# Patient Record
Sex: Female | Born: 1970 | Race: White | Hispanic: No | Marital: Married | Smoking: Current every day smoker
Health system: Southern US, Community
[De-identification: ages and names within clinical notes are randomized; demographics above are authoritative.]

## PROBLEM LIST (undated history)

## (undated) DIAGNOSIS — G8929 Other chronic pain: Secondary | ICD-10-CM

## (undated) DIAGNOSIS — A599 Trichomoniasis, unspecified: Secondary | ICD-10-CM

## (undated) DIAGNOSIS — R1013 Epigastric pain: Secondary | ICD-10-CM

## (undated) DIAGNOSIS — A549 Gonococcal infection, unspecified: Secondary | ICD-10-CM

## (undated) DIAGNOSIS — N7093 Salpingitis and oophoritis, unspecified: Secondary | ICD-10-CM

## (undated) DIAGNOSIS — W3400XA Accidental discharge from unspecified firearms or gun, initial encounter: Secondary | ICD-10-CM

## (undated) DIAGNOSIS — A5611 Chlamydial female pelvic inflammatory disease: Secondary | ICD-10-CM

## (undated) DIAGNOSIS — M549 Dorsalgia, unspecified: Secondary | ICD-10-CM

## (undated) HISTORY — PX: OOPHORECTOMY: SHX86

## (undated) HISTORY — PX: ABDOMINAL SURGERY: SHX537

---

## 2006-11-16 ENCOUNTER — Emergency Department (HOSPITAL_COMMUNITY): Admission: EM | Admit: 2006-11-16 | Discharge: 2006-11-16 | Payer: Self-pay | Admitting: Emergency Medicine

## 2007-02-24 ENCOUNTER — Emergency Department (HOSPITAL_COMMUNITY): Admission: EM | Admit: 2007-02-24 | Discharge: 2007-02-25 | Payer: Self-pay | Admitting: Emergency Medicine

## 2007-02-27 ENCOUNTER — Emergency Department (HOSPITAL_COMMUNITY): Admission: EM | Admit: 2007-02-27 | Discharge: 2007-02-28 | Payer: Self-pay | Admitting: Emergency Medicine

## 2007-02-28 ENCOUNTER — Emergency Department (HOSPITAL_COMMUNITY): Admission: EM | Admit: 2007-02-28 | Discharge: 2007-02-28 | Payer: Self-pay | Admitting: Emergency Medicine

## 2007-04-07 ENCOUNTER — Emergency Department (HOSPITAL_COMMUNITY): Admission: EM | Admit: 2007-04-07 | Discharge: 2007-04-07 | Payer: Self-pay | Admitting: Emergency Medicine

## 2007-04-08 ENCOUNTER — Inpatient Hospital Stay (HOSPITAL_COMMUNITY): Admission: AD | Admit: 2007-04-08 | Discharge: 2007-04-11 | Payer: Self-pay | Admitting: Psychiatry

## 2007-04-08 ENCOUNTER — Ambulatory Visit: Payer: Self-pay | Admitting: Psychiatry

## 2008-07-26 ENCOUNTER — Inpatient Hospital Stay: Admission: AD | Admit: 2008-07-26 | Discharge: 2008-07-26 | Payer: Self-pay | Admitting: Obstetrics

## 2008-07-26 ENCOUNTER — Inpatient Hospital Stay (HOSPITAL_COMMUNITY): Admission: AD | Admit: 2008-07-26 | Discharge: 2008-07-26 | Payer: Self-pay | Admitting: Obstetrics

## 2008-07-26 ENCOUNTER — Encounter (INDEPENDENT_AMBULATORY_CARE_PROVIDER_SITE_OTHER): Payer: Self-pay | Admitting: Obstetrics

## 2008-07-26 ENCOUNTER — Other Ambulatory Visit: Payer: Self-pay | Admitting: Obstetrics

## 2011-01-27 ENCOUNTER — Emergency Department (INDEPENDENT_AMBULATORY_CARE_PROVIDER_SITE_OTHER)
Admission: EM | Admit: 2011-01-27 | Discharge: 2011-01-27 | Payer: Self-pay | Source: Home / Self Care | Admitting: Emergency Medicine

## 2011-01-27 DIAGNOSIS — R109 Unspecified abdominal pain: Secondary | ICD-10-CM

## 2011-01-27 LAB — PREGNANCY, URINE: Preg Test, Ur: NEGATIVE

## 2011-01-27 LAB — URINALYSIS, ROUTINE W REFLEX MICROSCOPIC
Hgb urine dipstick: NEGATIVE
Nitrite: NEGATIVE
Urine Glucose, Fasting: NEGATIVE mg/dL
Urobilinogen, UA: 1 mg/dL (ref 0.0–1.0)

## 2011-01-27 LAB — DIFFERENTIAL
Basophils Absolute: 0.1 10*3/uL (ref 0.0–0.1)
Basophils Relative: 1 % (ref 0–1)
Eosinophils Absolute: 0.1 10*3/uL (ref 0.0–0.7)
Eosinophils Relative: 1 % (ref 0–5)
Lymphocytes Relative: 28 % (ref 12–46)
Lymphs Abs: 2.4 10*3/uL (ref 0.7–4.0)
Monocytes Absolute: 0.5 10*3/uL (ref 0.1–1.0)
Monocytes Relative: 6 % (ref 3–12)
Neutro Abs: 5.5 10*3/uL (ref 1.7–7.7)
Neutrophils Relative %: 64 % (ref 43–77)

## 2011-01-27 LAB — CBC
HCT: 40.4 % (ref 36.0–46.0)
Hemoglobin: 14 g/dL (ref 12.0–15.0)
MCH: 32.9 pg (ref 26.0–34.0)
MCHC: 34.7 g/dL (ref 30.0–36.0)
MCV: 95.1 fL (ref 78.0–100.0)
Platelets: 261 10*3/uL (ref 150–400)
RBC: 4.25 MIL/uL (ref 3.87–5.11)
RDW: 12.2 % (ref 11.5–15.5)
WBC: 8.6 10*3/uL (ref 4.0–10.5)

## 2011-01-27 LAB — BASIC METABOLIC PANEL
CO2: 24 mEq/L (ref 19–32)
Chloride: 112 mEq/L (ref 96–112)
GFR calc Af Amer: >60 mL/min (ref >60)
GFR calc non Af Amer: >60 mL/min (ref >60)

## 2011-05-08 ENCOUNTER — Emergency Department (HOSPITAL_COMMUNITY): Payer: Self-pay

## 2011-05-08 ENCOUNTER — Emergency Department (HOSPITAL_COMMUNITY)
Admission: EM | Admit: 2011-05-08 | Discharge: 2011-05-08 | Disposition: A | Discharge status: Home / Self Care | Payer: Self-pay | Attending: Emergency Medicine | Admitting: Emergency Medicine

## 2011-05-08 DIAGNOSIS — W1809XA Striking against other object with subsequent fall, initial encounter: Secondary | ICD-10-CM | POA: Insufficient documentation

## 2011-05-08 DIAGNOSIS — S2239XA Fracture of one rib, unspecified side, initial encounter for closed fracture: Secondary | ICD-10-CM | POA: Insufficient documentation

## 2011-05-08 DIAGNOSIS — F17200 Nicotine dependence, unspecified, uncomplicated: Secondary | ICD-10-CM | POA: Insufficient documentation
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
## 2011-08-14 ENCOUNTER — Emergency Department (HOSPITAL_COMMUNITY)
Admission: EM | Admit: 2011-08-14 | Discharge: 2011-08-14 | Disposition: A | Discharge status: Home / Self Care | Payer: Self-pay | Attending: Emergency Medicine | Admitting: Emergency Medicine

## 2011-08-14 DIAGNOSIS — K089 Disorder of teeth and supporting structures, unspecified: Secondary | ICD-10-CM | POA: Insufficient documentation

## 2011-09-04 ENCOUNTER — Emergency Department (HOSPITAL_COMMUNITY): Payer: No Typology Code available for payment source

## 2011-09-04 ENCOUNTER — Emergency Department (HOSPITAL_COMMUNITY)
Admission: EM | Admit: 2011-09-04 | Discharge: 2011-09-04 | Disposition: A | Discharge status: Home / Self Care | Payer: No Typology Code available for payment source | Attending: Emergency Medicine | Admitting: Emergency Medicine

## 2011-09-04 DIAGNOSIS — S139XXA Sprain of joints and ligaments of unspecified parts of neck, initial encounter: Secondary | ICD-10-CM | POA: Insufficient documentation

## 2011-09-04 DIAGNOSIS — Y92410 Unspecified street and highway as the place of occurrence of the external cause: Secondary | ICD-10-CM | POA: Insufficient documentation

## 2011-09-14 ENCOUNTER — Emergency Department (HOSPITAL_COMMUNITY)
Admission: EM | Admit: 2011-09-14 | Discharge: 2011-09-15 | Discharge status: Against Medical Advice | Payer: Self-pay | Attending: Emergency Medicine | Admitting: Emergency Medicine

## 2011-09-14 DIAGNOSIS — R109 Unspecified abdominal pain: Secondary | ICD-10-CM | POA: Insufficient documentation

## 2011-09-15 ENCOUNTER — Emergency Department (HOSPITAL_COMMUNITY)
Admission: EM | Admit: 2011-09-15 | Discharge: 2011-09-15 | Disposition: A | Discharge status: Home / Self Care | Payer: Self-pay | Attending: Emergency Medicine | Admitting: Emergency Medicine

## 2011-09-15 ENCOUNTER — Emergency Department (HOSPITAL_COMMUNITY): Payer: Self-pay

## 2011-09-15 DIAGNOSIS — F17200 Nicotine dependence, unspecified, uncomplicated: Secondary | ICD-10-CM | POA: Insufficient documentation

## 2011-09-15 DIAGNOSIS — R109 Unspecified abdominal pain: Secondary | ICD-10-CM | POA: Insufficient documentation

## 2011-09-15 DIAGNOSIS — K5900 Constipation, unspecified: Secondary | ICD-10-CM | POA: Insufficient documentation

## 2011-09-28 LAB — DIFFERENTIAL
Eosinophils Relative: 0
Lymphocytes Relative: 20
Lymphs Abs: 2
Neutrophils Relative %: 76

## 2011-09-28 LAB — CBC
HCT: 36.7
Hemoglobin: 12.6
MCHC: 34.8
MCV: 95.5
RBC: 4.42
RBC: 3.78 — ABNORMAL LOW
RDW: 12.9
WBC: 8.8

## 2011-09-28 LAB — URINE MICROSCOPIC-ADD ON

## 2011-09-28 LAB — POCT PREGNANCY, URINE: Operator id: 151321

## 2011-09-28 LAB — URINALYSIS, ROUTINE W REFLEX MICROSCOPIC
Nitrite: NEGATIVE
Specific Gravity, Urine: 1.009

## 2011-09-28 LAB — ABO/RH: ABO/RH(D): AB POS

## 2011-09-28 LAB — WET PREP, GENITAL: Yeast Wet Prep HPF POC: NONE SEEN

## 2011-09-28 LAB — TYPE AND SCREEN

## 2011-11-11 ENCOUNTER — Emergency Department (HOSPITAL_COMMUNITY)
Admission: EM | Admit: 2011-11-11 | Discharge: 2011-11-11 | Disposition: A | Discharge status: Home / Self Care | Payer: Self-pay | Attending: Emergency Medicine | Admitting: Emergency Medicine

## 2011-11-11 DIAGNOSIS — K047 Periapical abscess without sinus: Secondary | ICD-10-CM | POA: Insufficient documentation

## 2011-11-11 DIAGNOSIS — K0889 Other specified disorders of teeth and supporting structures: Secondary | ICD-10-CM

## 2011-11-11 DIAGNOSIS — F17200 Nicotine dependence, unspecified, uncomplicated: Secondary | ICD-10-CM | POA: Insufficient documentation

## 2011-11-11 MED ORDER — IBUPROFEN 600 MG PO TABS: 600.0000 mg | ORAL_TABLET | Freq: Four times a day (QID) | ORAL | Status: AC | PRN

## 2011-11-11 MED ORDER — HYDROCODONE-ACETAMINOPHEN 5-500 MG PO TABS: 1.0000 | ORAL_TABLET | Freq: Four times a day (QID) | ORAL | Status: AC | PRN

## 2011-11-11 MED ORDER — AMOXICILLIN 500 MG PO CAPS: 500.0000 mg | ORAL_CAPSULE | Freq: Three times a day (TID) | ORAL | Status: AC

## 2011-11-11 NOTE — ED Provider Notes (Signed)
History     CSN: [id number] Arrival date & time: 11/11/2011  7:38 AM   First MD Initiated Contact with Patient 11/11/11 0840      Chief Complaint  Patient presents with  . Dental Pain    states broken tooth lower back right states hurting all night no relief with ibuprofen states onset last week no apparent distress states pain 6/10    (Consider location/radiation/quality/duration/timing/severity/associated sxs/prior treatment) Patient is a 40 y.o. female presenting with tooth pain. The history is provided by the patient.  Dental PainPrimary symptoms do not include fever, shortness of breath or sore throat.  pt c/o toothpain in past few days. Hx same. Denies injury. No local dentist. No fever or chills. No facial redness or swelling. No trouble breathing or swallowing. Pain, constant, dull, non radiating, worse w chewing/eating.   History reviewed. No pertinent past medical history.  Past Surgical History  Procedure Date  . Abdominal surgery     No family history on file.  History  Substance Use Topics  . Smoking status: Current Everyday Smoker  . Smokeless tobacco: Not on file  . Alcohol Use: No    OB History    Grav Para Term Preterm Abortions TAB SAB Ect Mult Living                  Review of Systems  Constitutional: Negative for fever.  HENT: Negative for sore throat.   Respiratory: Negative for choking and shortness of breath.   Skin: Negative for rash.    Allergies  Review of patient's allergies indicates no known allergies.  Home Medications  No current outpatient prescriptions on file.  BP 132/69  Pulse 86  Temp(Src) 98.6 F (37 C) (Oral)  Resp 16  SpO2 99%  LMP 10/29/2011  Physical Exam  Nursing note and vitals reviewed. Constitutional: She appears well-developed and well-nourished. No distress.  HENT:       Right posterior molar decayed, broken off, assoc gum swelling and tenderness. No trismus. No pharyngeal or floor of mouth swelling or  tenderness.   Eyes: Conjunctivae are normal. No scleral icterus.  Neck: Neck supple. No tracheal deviation present.  Cardiovascular: Normal rate.   Pulmonary/Chest: Effort normal. No respiratory distress.  Abdominal: Normal appearance. She exhibits no distension.  Musculoskeletal: She exhibits no edema.  Neurological: She is alert.  Skin: Skin is warm and dry. No rash noted.  Psychiatric: She has a normal mood and affect.    ED Course  Procedures (including critical care time)  Labs Reviewed - No data to display No results found.   No diagnosis found.    MDM  Discussed need for close dental follow up. Confirmed nkda. Will give rx.         Suzi Roots, MD 11/11/11 [id number]

## 2012-01-13 ENCOUNTER — Encounter (HOSPITAL_COMMUNITY): Payer: Self-pay | Admitting: *Deleted

## 2012-01-13 ENCOUNTER — Emergency Department (HOSPITAL_COMMUNITY)
Admission: EM | Admit: 2012-01-13 | Discharge: 2012-01-13 | Disposition: A | Discharge status: Home / Self Care | Payer: Self-pay | Attending: Emergency Medicine | Admitting: Emergency Medicine

## 2012-01-13 DIAGNOSIS — F17200 Nicotine dependence, unspecified, uncomplicated: Secondary | ICD-10-CM | POA: Insufficient documentation

## 2012-01-13 DIAGNOSIS — K0889 Other specified disorders of teeth and supporting structures: Secondary | ICD-10-CM

## 2012-01-13 DIAGNOSIS — K029 Dental caries, unspecified: Secondary | ICD-10-CM | POA: Insufficient documentation

## 2012-01-13 DIAGNOSIS — K089 Disorder of teeth and supporting structures, unspecified: Secondary | ICD-10-CM | POA: Insufficient documentation

## 2012-01-13 MED ORDER — PENICILLIN V POTASSIUM 500 MG PO TABS: 500.0000 mg | ORAL_TABLET | Freq: Three times a day (TID) | ORAL | Status: AC

## 2012-01-13 MED ORDER — OXYCODONE-ACETAMINOPHEN 5-325 MG PO TABS: 2.0000 | ORAL_TABLET | ORAL | Status: AC | PRN

## 2012-01-13 NOTE — ED Provider Notes (Signed)
History     CSN: [id number]  Arrival date & time 01/13/12  1117   First MD Initiated Contact with Patient 01/13/12 1146      Chief Complaint  Patient presents with  . Dental Pain    (Consider location/radiation/quality/duration/timing/severity/associated sxs/prior treatment) Patient is a 41 y.o. female presenting with tooth pain. The history is provided by the patient.  Dental PainPrimary symptoms do not include dental injury, oral bleeding, oral lesions, headaches, fever or sore throat. The symptoms are waxing and waning.  Additional symptoms include: gum swelling and gum tenderness. Additional symptoms do not include: purulent gums, trismus, facial swelling and drooling.    History reviewed. No pertinent past medical history.  Past Surgical History  Procedure Date  . Abdominal surgery     History reviewed. No pertinent family history.  History  Substance Use Topics  . Smoking status: Current Everyday Smoker -- 1.0 packs/day    Types: Cigarettes  . Smokeless tobacco: Never Used  . Alcohol Use: No    OB History    Grav Para Term Preterm Abortions TAB SAB Ect Mult Living                  Review of Systems  Constitutional: Negative for fever and chills.  HENT: Negative for sore throat, facial swelling, drooling, neck pain and neck stiffness.   Neurological: Negative for headaches.    Allergies  Review of patient's allergies indicates no known allergies.  Home Medications   Current Outpatient Rx  Name Route Sig Dispense Refill  . IBUPROFEN 200 MG PO TABS Oral Take 600-800 mg by mouth every 6 (six) hours as needed. For pain      BP 141/80  Pulse 77  Temp(Src) 97.9 F (36.6 C) (Oral)  Resp 20  SpO2 99%  LMP 01/13/2012  Physical Exam  Nursing note and vitals reviewed. Constitutional: She is oriented to person, place, and time. She appears well-developed and well-nourished. No distress.  HENT:  Head: Normocephalic and atraumatic. No trismus in the jaw.    Right Ear: Tympanic membrane normal.  Left Ear: Tympanic membrane normal.  Mouth/Throat: Uvula is midline, oropharynx is clear and moist and mucous membranes are normal. No oral lesions. Dental caries present. No dental abscesses or uvula swelling.    Neck: Normal range of motion. Neck supple.  Cardiovascular: Normal rate, regular rhythm and normal heart sounds.   Pulmonary/Chest: Effort normal and breath sounds normal.  Neurological: She is alert and oriented to person, place, and time.  Skin: Skin is warm and dry. She is not diaphoretic.  Psychiatric: She has a normal mood and affect.    ED Course  Procedures (including critical care time)  Labs Reviewed - No data to display No results found.   No diagnosis found.    MDM  Patient with full ROM of neck and no trismus.  Therefore, not concerned for Ludwig's angina.  Feel that pain is secondary to tooth decay.  Patient given a short course of pain medication, penicillin, and instructed to follow up with Dentist tomorrow.  Patient given phone number for Dr. Mayford Knife the dentist on call.        Pascal Lux [facility] 01/13/12 1609

## 2012-01-13 NOTE — ED Notes (Signed)
Pt from home with c/o tooth pain in R bottom jaw "off and on" for 2-3 months.

## 2012-01-16 NOTE — ED Provider Notes (Signed)
Medical screening examination/treatment/procedure(s) were performed by non-physician practitioner and as supervising physician I was immediately available for consultation/collaboration.  Giancarlos Berendt T Ashyr Hedgepath, MD 01/16/12 1659 

## 2012-03-23 ENCOUNTER — Encounter (HOSPITAL_COMMUNITY): Payer: Self-pay | Admitting: *Deleted

## 2012-03-23 ENCOUNTER — Emergency Department (HOSPITAL_COMMUNITY): Payer: Self-pay

## 2012-03-23 ENCOUNTER — Emergency Department (HOSPITAL_COMMUNITY)
Admission: EM | Admit: 2012-03-23 | Discharge: 2012-03-23 | Disposition: A | Discharge status: Home / Self Care | Payer: Self-pay | Attending: Emergency Medicine | Admitting: Emergency Medicine

## 2012-03-23 DIAGNOSIS — R1012 Left upper quadrant pain: Secondary | ICD-10-CM | POA: Insufficient documentation

## 2012-03-23 DIAGNOSIS — R112 Nausea with vomiting, unspecified: Secondary | ICD-10-CM | POA: Insufficient documentation

## 2012-03-23 LAB — URINALYSIS, ROUTINE W REFLEX MICROSCOPIC
Glucose, UA: NEGATIVE mg/dL
Hgb urine dipstick: NEGATIVE
Leukocytes, UA: NEGATIVE
Specific Gravity, Urine: 1.023 (ref 1.005–1.030)
Urobilinogen, UA: 0.2 mg/dL (ref 0.0–1.0)

## 2012-03-23 LAB — COMPREHENSIVE METABOLIC PANEL
ALT: 15 U/L (ref 0–35)
AST: 17 U/L (ref 0–37)
Alkaline Phosphatase: 63 U/L (ref 39–117)
CO2: 30 mEq/L (ref 19–32)
Chloride: 102 mEq/L (ref 96–112)
GFR calc non Af Amer: 77 mL/min — ABNORMAL LOW (ref >90)
Potassium: 3.7 mEq/L (ref 3.5–5.1)
Sodium: 140 mEq/L (ref 135–145)
Total Bilirubin: 0.2 mg/dL — ABNORMAL LOW (ref 0.3–1.2)

## 2012-03-23 LAB — DIFFERENTIAL
Basophils Absolute: 0.1 10*3/uL (ref 0.0–0.1)
Lymphocytes Relative: 40 % (ref 12–46)
Monocytes Absolute: 0.3 10*3/uL (ref 0.1–1.0)
Neutro Abs: 2.7 10*3/uL (ref 1.7–7.7)
Neutrophils Relative %: 48 % (ref 43–77)

## 2012-03-23 LAB — CBC
HCT: 39.9 % (ref 36.0–46.0)
Platelets: 280 10*3/uL (ref 150–400)
RDW: 12.1 % (ref 11.5–15.5)
WBC: 5.5 10*3/uL (ref 4.0–10.5)

## 2012-03-23 LAB — PREGNANCY, URINE: Preg Test, Ur: NEGATIVE

## 2012-03-23 MED ORDER — SODIUM CHLORIDE 0.9 % IV BOLUS (SEPSIS)
1000.0000 mL | Freq: Once | INTRAVENOUS | Status: AC
  Administered 2012-03-23 (×2): 1000 mL via INTRAVENOUS

## 2012-03-23 MED ORDER — MORPHINE SULFATE 4 MG/ML IJ SOLN
4.0000 mg | Freq: Once | INTRAMUSCULAR | Status: AC
  Administered 2012-03-23: 4 mg via INTRAVENOUS
  Filled 2012-03-23: qty 1

## 2012-03-23 MED ORDER — MAGNESIUM CITRATE PO SOLN: 296.0000 mL | Freq: Once | ORAL | Status: AC

## 2012-03-23 MED ORDER — DOCUSATE SODIUM 100 MG PO CAPS: 100.0000 mg | ORAL_CAPSULE | Freq: Two times a day (BID) | ORAL | Status: AC

## 2012-03-23 MED ORDER — IOHEXOL 300 MG/ML  SOLN
100.0000 mL | Freq: Once | INTRAMUSCULAR | Status: AC | PRN
  Administered 2012-03-23: 100 mL via INTRAVENOUS
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
## 2012-03-23 NOTE — ED Notes (Signed)
Pt from home with reports of LUQ pain x 2 days, also endorses nausea.

## 2012-03-23 NOTE — Discharge Instructions (Signed)
Abdominal Pain  Abdominal pain can be caused by many things. Your caregiver decides the seriousness of your pain by an examination and possibly blood tests and X-rays. Many cases can be observed and treated at home. Most abdominal pain is not caused by a disease and will probably improve without treatment. However, in many cases, more time must pass before a clear cause of the pain can be found. Before that point, it may not be known if you need more testing, or if hospitalization or surgery is needed.  HOME CARE INSTRUCTIONS    Do not take laxatives unless directed by your caregiver.   Take pain medicine only as directed by your caregiver.   Only take over-the-counter or prescription medicines for pain, discomfort, or fever as directed by your caregiver.   Try a clear liquid diet (broth, tea, or water) for as long as directed by your caregiver. Slowly move to a bland diet as tolerated.  SEEK IMMEDIATE MEDICAL CARE IF:    The pain does not go away.   You have a fever.   You keep throwing up (vomiting).   The pain is felt only in portions of the abdomen. Pain in the right side could possibly be appendicitis. In an adult, pain in the left lower portion of the abdomen could be colitis or diverticulitis.   You pass bloody or black tarry stools.  MAKE SURE YOU:    Understand these instructions.   Will watch your condition.   Will get help right away if you are not doing well or get worse.  Document Released: 09/26/2005 Document Revised: 12/06/2011 Document Reviewed: 08/04/2008  ExitCare Patient Information 2012 [employer]  Constipation in Adults  Constipation is having fewer than 2 bowel movements per week. Usually, the stools are hard. As we grow older, constipation is more common. If you try to fix constipation with laxatives, the problem may get worse. This is because laxatives taken over a long period of time make the colon muscles weaker. A low-fiber diet, not taking in enough fluids, and taking  some medicines may make these problems worse.  MEDICATIONS THAT MAY CAUSE CONSTIPATION   Water pills (diuretics).   Calcium channel blockers (used to control blood pressure and for the heart).   Certain pain medicines (narcotics).   Anticholinergics.   Anti-inflammatory agents.   Antacids that contain aluminum.  DISEASES THAT CONTRIBUTE TO CONSTIPATION   Diabetes.   Parkinson's disease.   Dementia.   Stroke.   Depression.   Illnesses that cause problems with salt and water metabolism.  HOME CARE INSTRUCTIONS    Constipation is usually best cared for without medicines. Increasing dietary fiber and eating more fruits and vegetables is the best way to manage constipation.   Slowly increase fiber intake to 25 to 38 grams per day. Whole grains, fruits, vegetables, and legumes are good sources of fiber. A dietitian can further help you incorporate high-fiber foods into your diet.   Drink enough water and fluids to keep your urine clear or pale yellow.   A fiber supplement may be added to your diet if you cannot get enough fiber from foods.   Increasing your activities also helps improve regularity.   Suppositories, as suggested by your caregiver, will also help. If you are using antacids, such as aluminum or calcium containing products, it will be helpful to switch to products containing magnesium if your caregiver says it is okay.   If you have been given a liquid injection (enema) today,   this is only a temporary measure. It should not be relied on for treatment of longstanding (chronic) constipation.   Stronger measures, such as magnesium sulfate, should be avoided if possible. This may cause uncontrollable diarrhea. Using magnesium sulfate may not allow you time to make it to the bathroom.  SEEK IMMEDIATE MEDICAL CARE IF:    There is bright red blood in the stool.   The constipation stays for more than 4 days.   There is belly (abdominal) or rectal pain.   You do not seem to be getting  better.   You have any questions or concerns.  MAKE SURE YOU:    Understand these instructions.   Will watch your condition.   Will get help right away if you are not doing well or get worse.  Document Released: 09/14/2004 Document Revised: 12/06/2011 Document Reviewed: 11/20/2011  [employer] Patient Information 2012 [employer].

## 2012-11-19 ENCOUNTER — Emergency Department (HOSPITAL_COMMUNITY)
Admission: EM | Admit: 2012-11-19 | Discharge: 2012-11-19 | Disposition: A | Discharge status: Home / Self Care | Payer: Self-pay | Attending: Emergency Medicine | Admitting: Emergency Medicine

## 2012-11-19 ENCOUNTER — Encounter (HOSPITAL_COMMUNITY): Payer: Self-pay | Admitting: *Deleted

## 2012-11-19 DIAGNOSIS — F17200 Nicotine dependence, unspecified, uncomplicated: Secondary | ICD-10-CM | POA: Insufficient documentation

## 2012-11-19 DIAGNOSIS — N939 Abnormal uterine and vaginal bleeding, unspecified: Secondary | ICD-10-CM

## 2012-11-19 DIAGNOSIS — R319 Hematuria, unspecified: Secondary | ICD-10-CM | POA: Insufficient documentation

## 2012-11-19 DIAGNOSIS — K921 Melena: Secondary | ICD-10-CM | POA: Insufficient documentation

## 2012-11-19 DIAGNOSIS — R110 Nausea: Secondary | ICD-10-CM | POA: Insufficient documentation

## 2012-11-19 DIAGNOSIS — N898 Other specified noninflammatory disorders of vagina: Secondary | ICD-10-CM | POA: Insufficient documentation

## 2012-11-19 DIAGNOSIS — R109 Unspecified abdominal pain: Secondary | ICD-10-CM

## 2012-11-19 LAB — CBC WITH DIFFERENTIAL/PLATELET
Basophils Absolute: 0.1 10*3/uL (ref 0.0–0.1)
Eosinophils Relative: 6 % — ABNORMAL HIGH (ref 0–5)
HCT: 41.4 % (ref 36.0–46.0)
Lymphocytes Relative: 38 % (ref 12–46)
Lymphs Abs: 1.9 10*3/uL (ref 0.7–4.0)
MCV: 92.2 fL (ref 78.0–100.0)
Monocytes Absolute: 0.4 10*3/uL (ref 0.1–1.0)
Neutro Abs: 2.3 10*3/uL (ref 1.7–7.7)
Platelets: 240 10*3/uL (ref 150–400)
RBC: 4.49 MIL/uL (ref 3.87–5.11)
WBC: 4.9 10*3/uL (ref 4.0–10.5)

## 2012-11-19 LAB — URINALYSIS, ROUTINE W REFLEX MICROSCOPIC
Bilirubin Urine: NEGATIVE
Glucose, UA: NEGATIVE mg/dL
Hgb urine dipstick: NEGATIVE
Ketones, ur: NEGATIVE mg/dL
Ketones, ur: NEGATIVE mg/dL
Nitrite: NEGATIVE
Protein, ur: NEGATIVE mg/dL
Protein, ur: NEGATIVE mg/dL
Specific Gravity, Urine: 1.007 (ref 1.005–1.030)
Urobilinogen, UA: 0.2 mg/dL (ref 0.0–1.0)
pH: 7 (ref 5.0–8.0)

## 2012-11-19 LAB — URINE MICROSCOPIC-ADD ON

## 2012-11-19 LAB — WET PREP, GENITAL
Trich, Wet Prep: NONE SEEN
Yeast Wet Prep HPF POC: NONE SEEN

## 2012-11-19 LAB — BASIC METABOLIC PANEL
CO2: 28 mEq/L (ref 19–32)
Calcium: 9.1 mg/dL (ref 8.4–10.5)
Chloride: 102 mEq/L (ref 96–112)
Glucose, Bld: 96 mg/dL (ref 70–99)
Sodium: 138 mEq/L (ref 135–145)

## 2012-11-19 LAB — OCCULT BLOOD, POC DEVICE: Fecal Occult Bld: NEGATIVE

## 2012-11-19 MED ORDER — ONDANSETRON HCL 4 MG/2ML IJ SOLN
4.0000 mg | Freq: Once | INTRAMUSCULAR | Status: AC
  Administered 2012-11-19: 4 mg via INTRAVENOUS
  Filled 2012-11-19: qty 2

## 2012-11-19 MED ORDER — SODIUM CHLORIDE 0.9 % IV SOLN
Freq: Once | INTRAVENOUS | Status: AC
  Administered 2012-11-19: 13:00:00 via INTRAVENOUS

## 2012-11-19 MED ORDER — HYDROMORPHONE HCL PF 1 MG/ML IJ SOLN
1.0000 mg | Freq: Once | INTRAMUSCULAR | Status: AC
  Administered 2012-11-19: 1 mg via INTRAVENOUS
  Filled 2012-11-19: qty 1

## 2012-11-19 MED ORDER — OXYCODONE HCL 5 MG PO CAPS: 5.0000 mg | ORAL_CAPSULE | ORAL | Status: DC | PRN

## 2012-11-19 MED ORDER — KETOROLAC TROMETHAMINE 30 MG/ML IJ SOLN
30.0000 mg | Freq: Once | INTRAMUSCULAR | Status: AC
  Administered 2012-11-19: 30 mg via INTRAVENOUS
  Filled 2012-11-19: qty 1

## 2012-11-19 MED ORDER — HYDROMORPHONE HCL PF 1 MG/ML IJ SOLN
0.5000 mg | Freq: Once | INTRAMUSCULAR | Status: AC
  Administered 2012-11-19: 0.5 mg via INTRAVENOUS
  Filled 2012-11-19: qty 1

## 2012-11-19 MED ORDER — ONDANSETRON HCL 4 MG PO TABS: 4.0000 mg | ORAL_TABLET | Freq: Four times a day (QID) | ORAL | Status: DC
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
## 2012-11-19 NOTE — ED Provider Notes (Signed)
Medical screening examination/treatment/procedure(s) were performed by non-physician practitioner and as supervising physician I was immediately available for consultation/collaboration.  Flint Melter, MD 11/19/12 2029

## 2012-11-19 NOTE — ED Notes (Signed)
Pt c/o lower abd pain and blood in urine x's 2 days. Denies fevers. Reports nausea denies vomiting. Denies hx of same.

## 2012-11-20 LAB — GC/CHLAMYDIA PROBE AMP
CT Probe RNA: NEGATIVE
GC Probe RNA: NEGATIVE

## 2013-02-25 ENCOUNTER — Emergency Department (HOSPITAL_COMMUNITY)
Admission: EM | Admit: 2013-02-25 | Discharge: 2013-02-25 | Disposition: A | Discharge status: Home / Self Care | Payer: Self-pay | Attending: Emergency Medicine | Admitting: Emergency Medicine

## 2013-02-25 ENCOUNTER — Encounter (HOSPITAL_COMMUNITY): Payer: Self-pay

## 2013-02-25 ENCOUNTER — Emergency Department (HOSPITAL_COMMUNITY): Payer: Self-pay

## 2013-02-25 DIAGNOSIS — F17200 Nicotine dependence, unspecified, uncomplicated: Secondary | ICD-10-CM | POA: Insufficient documentation

## 2013-02-25 DIAGNOSIS — M25512 Pain in left shoulder: Secondary | ICD-10-CM

## 2013-02-25 DIAGNOSIS — N644 Mastodynia: Secondary | ICD-10-CM | POA: Insufficient documentation

## 2013-02-25 DIAGNOSIS — Z87828 Personal history of other (healed) physical injury and trauma: Secondary | ICD-10-CM | POA: Insufficient documentation

## 2013-02-25 DIAGNOSIS — M25519 Pain in unspecified shoulder: Secondary | ICD-10-CM | POA: Insufficient documentation

## 2013-02-25 HISTORY — DX: Accidental discharge from unspecified firearms or gun, initial encounter: W34.00XA

## 2013-02-25 MED ORDER — TRAMADOL HCL 50 MG PO TABS: 50.0000 mg | ORAL_TABLET | Freq: Four times a day (QID) | ORAL | Status: DC | PRN

## 2013-02-25 MED ORDER — NAPROXEN 500 MG PO TABS: 500.0000 mg | ORAL_TABLET | Freq: Two times a day (BID) | ORAL | Status: DC

## 2013-02-25 NOTE — ED Notes (Signed)
Pt c/o lt shoulder pain x2wks after moving boxes, c/o knot to lt breast since 12/13, no acute problems
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
## 2013-02-25 NOTE — ED Notes (Signed)
md at bedsided examing pts left breast, rn in room as witness.   Pt alert and oriented x4. Respirations even and unlabored, bilateral symmetrical rise and fall of chest. Skin warm and dry. In no acute distress. Denies needs.

## 2013-02-25 NOTE — ED Notes (Signed)
Pt escorted to discharge window. Pt verbalized understanding discharge instructions. In no acute distress.  

## 2013-02-27 ENCOUNTER — Telehealth (HOSPITAL_COMMUNITY): Payer: Self-pay | Admitting: Emergency Medicine

## 2013-03-12 ENCOUNTER — Encounter: Payer: Self-pay | Admitting: Obstetrics & Gynecology

## 2013-09-02 ENCOUNTER — Emergency Department (HOSPITAL_COMMUNITY)
Admission: EM | Admit: 2013-09-02 | Discharge: 2013-09-02 | Disposition: A | Discharge status: Home / Self Care | Payer: Self-pay | Attending: Emergency Medicine | Admitting: Emergency Medicine

## 2013-09-02 ENCOUNTER — Encounter (HOSPITAL_COMMUNITY): Payer: Self-pay

## 2013-09-02 DIAGNOSIS — M5412 Radiculopathy, cervical region: Secondary | ICD-10-CM | POA: Insufficient documentation

## 2013-09-02 DIAGNOSIS — Z87828 Personal history of other (healed) physical injury and trauma: Secondary | ICD-10-CM | POA: Insufficient documentation

## 2013-09-02 DIAGNOSIS — M5410 Radiculopathy, site unspecified: Secondary | ICD-10-CM

## 2013-09-02 DIAGNOSIS — M542 Cervicalgia: Secondary | ICD-10-CM

## 2013-09-02 DIAGNOSIS — F17200 Nicotine dependence, unspecified, uncomplicated: Secondary | ICD-10-CM | POA: Insufficient documentation

## 2013-09-02 DIAGNOSIS — Z9889 Other specified postprocedural states: Secondary | ICD-10-CM | POA: Insufficient documentation

## 2013-09-02 MED ORDER — KETOROLAC TROMETHAMINE 60 MG/2ML IM SOLN
60.0000 mg | Freq: Once | INTRAMUSCULAR | Status: AC
  Administered 2013-09-02: 60 mg via INTRAMUSCULAR
  Filled 2013-09-02: qty 2

## 2013-09-02 MED ORDER — TRAMADOL HCL 50 MG PO TABS
50.0000 mg | ORAL_TABLET | Freq: Once | ORAL | Status: AC
  Administered 2013-09-02: 50 mg via ORAL
  Filled 2013-09-02: qty 1

## 2013-09-02 MED ORDER — DIAZEPAM 5 MG PO TABS: 5.0000 mg | ORAL_TABLET | Freq: Four times a day (QID) | ORAL | Status: DC | PRN

## 2013-09-02 MED ORDER — PREDNISONE 20 MG PO TABS: ORAL_TABLET | ORAL | Status: DC

## 2013-09-02 MED ORDER — DIAZEPAM 5 MG PO TABS
10.0000 mg | ORAL_TABLET | Freq: Once | ORAL | Status: AC
  Administered 2013-09-02: 10 mg via ORAL
  Filled 2013-09-02: qty 2

## 2013-09-02 MED ORDER — TRAMADOL HCL 50 MG PO TABS: 50.0000 mg | ORAL_TABLET | Freq: Four times a day (QID) | ORAL | Status: DC | PRN
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
## 2013-09-02 NOTE — ED Notes (Addendum)
Pt c/o neck pain x "a couple weeks."  Pt sts pain continues to progress.  Denies injury.  Pain score 10/10.  Pt sts pain radiates into L shoulder and sts tingling in L fingers.  Vitals are stable.

## 2014-04-14 ENCOUNTER — Emergency Department (HOSPITAL_COMMUNITY)
Admission: EM | Admit: 2014-04-14 | Discharge: 2014-04-14 | Disposition: A | Discharge status: Home / Self Care | Payer: Self-pay | Attending: Emergency Medicine | Admitting: Emergency Medicine

## 2014-04-14 ENCOUNTER — Encounter (HOSPITAL_COMMUNITY): Payer: Self-pay | Admitting: Emergency Medicine

## 2014-04-14 ENCOUNTER — Emergency Department (HOSPITAL_COMMUNITY): Payer: Self-pay

## 2014-04-14 DIAGNOSIS — S0990XA Unspecified injury of head, initial encounter: Secondary | ICD-10-CM

## 2014-04-14 DIAGNOSIS — S6990XA Unspecified injury of unspecified wrist, hand and finger(s), initial encounter: Secondary | ICD-10-CM | POA: Insufficient documentation

## 2014-04-14 DIAGNOSIS — W1809XA Striking against other object with subsequent fall, initial encounter: Secondary | ICD-10-CM | POA: Insufficient documentation

## 2014-04-14 DIAGNOSIS — Y92009 Unspecified place in unspecified non-institutional (private) residence as the place of occurrence of the external cause: Secondary | ICD-10-CM | POA: Insufficient documentation

## 2014-04-14 DIAGNOSIS — S4980XA Other specified injuries of shoulder and upper arm, unspecified arm, initial encounter: Secondary | ICD-10-CM | POA: Insufficient documentation

## 2014-04-14 DIAGNOSIS — W010XXA Fall on same level from slipping, tripping and stumbling without subsequent striking against object, initial encounter: Secondary | ICD-10-CM | POA: Insufficient documentation

## 2014-04-14 DIAGNOSIS — F17200 Nicotine dependence, unspecified, uncomplicated: Secondary | ICD-10-CM | POA: Insufficient documentation

## 2014-04-14 DIAGNOSIS — G8929 Other chronic pain: Secondary | ICD-10-CM | POA: Insufficient documentation

## 2014-04-14 DIAGNOSIS — IMO0002 Reserved for concepts with insufficient information to code with codable children: Secondary | ICD-10-CM | POA: Insufficient documentation

## 2014-04-14 DIAGNOSIS — S46909A Unspecified injury of unspecified muscle, fascia and tendon at shoulder and upper arm level, unspecified arm, initial encounter: Secondary | ICD-10-CM | POA: Insufficient documentation

## 2014-04-14 DIAGNOSIS — W19XXXA Unspecified fall, initial encounter: Secondary | ICD-10-CM

## 2014-04-14 DIAGNOSIS — Y9389 Activity, other specified: Secondary | ICD-10-CM | POA: Insufficient documentation

## 2014-04-14 DIAGNOSIS — S139XXA Sprain of joints and ligaments of unspecified parts of neck, initial encounter: Secondary | ICD-10-CM | POA: Insufficient documentation

## 2014-04-14 MED ORDER — OXYCODONE HCL 5 MG PO TABS
5.0000 mg | ORAL_TABLET | Freq: Once | ORAL | Status: AC
  Administered 2014-04-14: 5 mg via ORAL
  Filled 2014-04-14: qty 1

## 2014-04-14 NOTE — Discharge Instructions (Signed)
CT of the head and neck are negative. Try heating pad. Continue your medications. Rest. Follow up with your doctor. Return if symptoms worsening.    Cervical Sprain A cervical sprain is an injury in the neck in which the strong, fibrous tissues (ligaments) that connect your neck bones stretch or tear. Cervical sprains can range from mild to severe. Severe cervical sprains can cause the neck vertebrae to be unstable. This can lead to damage of the spinal cord and can result in serious nervous system problems. The amount of time it takes for a cervical sprain to get better depends on the cause and extent of the injury. Most cervical sprains heal in 1 to 3 weeks. CAUSES  Severe cervical sprains may be caused by:   Contact sport injuries (such as from football, rugby, wrestling, hockey, auto racing, gymnastics, diving, martial arts, or boxing).   Motor vehicle collisions.   Whiplash injuries. This is an injury from a sudden forward-and backward whipping movement of the head and neck.  Falls.  Mild cervical sprains may be caused by:   Being in an awkward position, such as while cradling a telephone between your ear and shoulder.   Sitting in a chair that does not offer proper support.   Working at a poorly Marketing executivedesigned computer station.   Looking up or down for long periods of time.  SYMPTOMS   Pain, soreness, stiffness, or a burning sensation in the front, back, or sides of the neck. This discomfort may develop immediately after the injury or slowly, 24 hours or more after the injury.   Pain or tenderness directly in the middle of the back of the neck.   Shoulder or upper back pain.   Limited ability to move the neck.   Headache.   Dizziness.   Weakness, numbness, or tingling in the hands or arms.   Muscle spasms.   Difficulty swallowing or chewing.   Tenderness and swelling of the neck.  DIAGNOSIS  Most of the time your health care provider can diagnose a  cervical sprain by taking your history and doing a physical exam. Your health care provider will ask about previous neck injuries and any known neck problems, such as arthritis in the neck. X-rays may be taken to find out if there are any other problems, such as with the bones of the neck. Other tests, such as a CT scan or MRI, may also be needed.  TREATMENT  Treatment depends on the severity of the cervical sprain. Mild sprains can be treated with rest, keeping the neck in place (immobilization), and pain medicines. Severe cervical sprains are immediately immobilized. Further treatment is done to help with pain, muscle spasms, and other symptoms and may include:  Medicines, such as pain relievers, numbing medicines, or muscle relaxants.   Physical therapy. This may involve stretching exercises, strengthening exercises, and posture training. Exercises and improved posture can help stabilize the neck, strengthen muscles, and help stop symptoms from returning.  HOME CARE INSTRUCTIONS   Put ice on the injured area.   Put ice in a plastic bag.   Place a towel between your skin and the bag.   Leave the ice on for 15 20 minutes, 3 4 times a day.   If your injury was severe, you may have been given a cervical collar to wear. A cervical collar is a two-piece collar designed to keep your neck from moving while it heals.  Do not remove the collar unless instructed by your health  care provider.  If you have long hair, keep it outside of the collar.  Ask your health care provider before making any adjustments to your collar. Minor adjustments may be required over time to improve comfort and reduce pressure on your chin or on the back of your head.  Ifyou are allowed to remove the collar for cleaning or bathing, follow your health care provider's instructions on how to do so safely.  Keep your collar clean by wiping it with mild soap and water and drying it completely. If the collar you have  been given includes removable pads, remove them every 1 2 days and hand wash them with soap and water. Allow them to air dry. They should be completely dry before you wear them in the collar.  If you are allowed to remove the collar for cleaning and bathing, wash and dry the skin of your neck. Check your skin for irritation or sores. If you see any, tell your health care provider.  Do not drive while wearing the collar.   Only take over-the-counter or prescription medicines for pain, discomfort, or fever as directed by your health care provider.   Keep all follow-up appointments as directed by your health care provider.   Keep all physical therapy appointments as directed by your health care provider.   Make any needed adjustments to your workstation to promote good posture.   Avoid positions and activities that make your symptoms worse.   Warm up and stretch before being active to help prevent problems.  SEEK MEDICAL CARE IF:   Your pain is not controlled with medicine.   You are unable to decrease your pain medicine over time as planned.   Your activity level is not improving as expected.  SEEK IMMEDIATE MEDICAL CARE IF:   You develop any bleeding.  You develop stomach upset.  You have signs of an allergic reaction to your medicine.   Your symptoms get worse.   You develop new, unexplained symptoms.   You have numbness, tingling, weakness, or paralysis in any part of your body.  MAKE SURE YOU:   Understand these instructions.  Will watch your condition.  Will get help right away if you are not doing well or get worse. Document Released: 10/14/2007 Document Revised: 10/07/2013 Document Reviewed: 06/24/2013 [facility] Patient Information 2014 [place], [place].

## 2014-04-14 NOTE — ED Notes (Signed)
Pt from home c/o of fall in which she tripped over broken pieces of the sidewalk at her home. She reports that she fell and rolled down an embankment, her clothes are wet from the fall. She is having neck pain from where she hit her head. NO LOC. No HX of blood thinner use.
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
## 2014-04-15 NOTE — ED Provider Notes (Signed)
Medical screening examination/treatment/procedure(s) were performed by non-physician practitioner and as supervising physician I was immediately available for consultation/collaboration.   EKG Interpretation None        William Tawnie Ehresman, MD 04/15/14 0005 

## 2014-11-15 ENCOUNTER — Emergency Department (HOSPITAL_COMMUNITY): Payer: Self-pay

## 2014-11-15 ENCOUNTER — Encounter (HOSPITAL_COMMUNITY): Payer: Self-pay | Admitting: *Deleted

## 2014-11-15 ENCOUNTER — Emergency Department (HOSPITAL_COMMUNITY)
Admission: EM | Admit: 2014-11-15 | Discharge: 2014-11-15 | Disposition: A | Discharge status: Home / Self Care | Payer: Self-pay | Attending: Emergency Medicine | Admitting: Emergency Medicine

## 2014-11-15 DIAGNOSIS — Y9389 Activity, other specified: Secondary | ICD-10-CM | POA: Insufficient documentation

## 2014-11-15 DIAGNOSIS — S3992XA Unspecified injury of lower back, initial encounter: Secondary | ICD-10-CM | POA: Insufficient documentation

## 2014-11-15 DIAGNOSIS — W07XXXA Fall from chair, initial encounter: Secondary | ICD-10-CM | POA: Insufficient documentation

## 2014-11-15 DIAGNOSIS — S3991XA Unspecified injury of abdomen, initial encounter: Secondary | ICD-10-CM | POA: Insufficient documentation

## 2014-11-15 DIAGNOSIS — M549 Dorsalgia, unspecified: Secondary | ICD-10-CM

## 2014-11-15 DIAGNOSIS — Z720 Tobacco use: Secondary | ICD-10-CM | POA: Insufficient documentation

## 2014-11-15 DIAGNOSIS — G8929 Other chronic pain: Secondary | ICD-10-CM | POA: Insufficient documentation

## 2014-11-15 DIAGNOSIS — Y9289 Other specified places as the place of occurrence of the external cause: Secondary | ICD-10-CM | POA: Insufficient documentation

## 2014-11-15 DIAGNOSIS — Y998 Other external cause status: Secondary | ICD-10-CM | POA: Insufficient documentation

## 2014-11-15 MED ORDER — OXYCODONE HCL 15 MG PO TABS: 15.0000 mg | ORAL_TABLET | Freq: Four times a day (QID) | ORAL | Status: DC | PRN

## 2014-11-15 MED ORDER — OXYCODONE HCL 5 MG PO TABS
15.0000 mg | ORAL_TABLET | Freq: Once | ORAL | Status: AC
  Administered 2014-11-15: 15 mg via ORAL
  Filled 2014-11-15: qty 3

## 2014-11-15 NOTE — Discharge Instructions (Signed)
Cryotherapy °Cryotherapy means treatment with cold. Ice or gel packs can be used to reduce both pain and swelling. Ice is the most helpful within the first 24 to 48 hours after an injury or flare-up from overusing a muscle or joint. Sprains, strains, spasms, burning pain, shooting pain, and aches can all be eased with ice. Ice can also be used when recovering from surgery. Ice is effective, has very few side effects, and is safe for most people to use. °PRECAUTIONS  °Ice is not a safe treatment option for people with: °· Raynaud phenomenon. This is a condition affecting small blood vessels in the extremities. Exposure to cold may cause your problems to return. °· Cold hypersensitivity. There are many forms of cold hypersensitivity, including: °¨ Cold urticaria. Red, itchy hives appear on the skin when the tissues begin to warm after being iced. °¨ Cold erythema. This is a red, itchy rash caused by exposure to cold. °¨ Cold hemoglobinuria. Red blood cells break down when the tissues begin to warm after being iced. The hemoglobin that carry oxygen are passed into the urine because they cannot combine with blood proteins fast enough. °· Numbness or altered sensitivity in the area being iced. °If you have any of the following conditions, do not use ice until you have discussed cryotherapy with your caregiver: °· Heart conditions, such as arrhythmia, angina, or chronic heart disease. °· High blood pressure. °· Healing wounds or open skin in the area being iced. °· Current infections. °· Rheumatoid arthritis. °· Poor circulation. °· Diabetes. °Ice slows the blood flow in the region it is applied. This is beneficial when trying to stop inflamed tissues from spreading irritating chemicals to surrounding tissues. However, if you expose your skin to cold temperatures for too long or without the proper protection, you can damage your skin or nerves. Watch for signs of skin damage due to cold. °HOME CARE INSTRUCTIONS °Follow  these tips to use ice and cold packs safely. °· Place a dry or damp towel between the ice and skin. A damp towel will cool the skin more quickly, so you may need to shorten the time that the ice is used. °· For a more rapid response, add gentle compression to the ice. °· Ice for no more than 10 to 20 minutes at a time. The bonier the area you are icing, the less time it will take to get the benefits of ice. °· Check your skin after 5 minutes to make sure there are no signs of a poor response to cold or skin damage. °· Rest 20 minutes or more between uses. °· Once your skin is numb, you can end your treatment. You can test numbness by very lightly touching your skin. The touch should be so light that you do not see the skin dimple from the pressure of your fingertip. When using ice, most people will feel these normal sensations in this order: cold, burning, aching, and numbness. °· Do not use ice on someone who cannot communicate their responses to pain, such as small children or people with dementia. °HOW TO MAKE AN ICE PACK °Ice packs are the most common way to use ice therapy. Other methods include ice massage, ice baths, and cryosprays. Muscle creams that cause a cold, tingly feeling do not offer the same benefits that ice offers and should not be used as a substitute unless recommended by your caregiver. °To make an ice pack, do one of the following: °· Place crushed ice or a   bag of frozen vegetables in a sealable plastic bag. Squeeze out the excess air. Place this bag inside another plastic bag. Slide the bag into a pillowcase or place a damp towel between your skin and the bag. °· Mix 3 parts water with 1 part rubbing alcohol. Freeze the mixture in a sealable plastic bag. When you remove the mixture from the freezer, it will be slushy. Squeeze out the excess air. Place this bag inside another plastic bag. Slide the bag into a pillowcase or place a damp towel between your skin and the bag. °SEEK MEDICAL CARE  IF: °· You develop white spots on your skin. This may give the skin a blotchy (mottled) appearance. °· Your skin turns blue or pale. °· Your skin becomes waxy or hard. °· Your swelling gets worse. °MAKE SURE YOU:  °· Understand these instructions. °· Will watch your condition. °· Will get help right away if you are not doing well or get worse. °Document Released: 08/13/2011 Document Revised: 05/03/2014 Document Reviewed: 08/13/2011 °ExitCare® Patient Information ©2015 [employer]. This information is not intended to replace advice given to you by your health care provider. Make sure you discuss any questions you have with your health care provider. ° °
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
## 2014-11-15 NOTE — ED Notes (Signed)
Pt states that she slid this evening trying to get in the chair; pt states that she landed on her bottom and is having lower back pain; pt also c/o RLQ abd pain after fall; pt ambulatory without difficulty; denies numbness or tingling

## 2014-11-15 NOTE — ED Notes (Addendum)
Tenderness to right lower back.

## 2014-11-23 ENCOUNTER — Encounter (HOSPITAL_COMMUNITY): Payer: Self-pay | Admitting: Emergency Medicine

## 2014-11-23 ENCOUNTER — Emergency Department (HOSPITAL_COMMUNITY)
Admission: EM | Admit: 2014-11-23 | Discharge: 2014-11-23 | Disposition: A | Discharge status: Home / Self Care | Payer: Self-pay | Attending: Emergency Medicine | Admitting: Emergency Medicine

## 2014-11-23 DIAGNOSIS — Z720 Tobacco use: Secondary | ICD-10-CM | POA: Insufficient documentation

## 2014-11-23 DIAGNOSIS — M545 Low back pain, unspecified: Secondary | ICD-10-CM

## 2014-11-23 DIAGNOSIS — Z87828 Personal history of other (healed) physical injury and trauma: Secondary | ICD-10-CM | POA: Insufficient documentation

## 2014-11-23 MED ORDER — OXYCODONE HCL 10 MG PO TABS: 10.0000 mg | ORAL_TABLET | Freq: Two times a day (BID) | ORAL | Status: DC

## 2014-11-23 MED ORDER — OXYCODONE HCL 5 MG PO TABS: 10.0000 mg | ORAL_TABLET | ORAL | Status: DC | PRN

## 2014-11-23 NOTE — Discharge Instructions (Signed)
Your evaluated today in the ED for your chronic back pain. It is imperative that she set up care with a primary provider. [facility]in clinic Monday through Thursday from 9 AM to 10:30 AM or from 2 PM to 3:30 PM.
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
## 2014-11-23 NOTE — ED Notes (Signed)
Bed: WTR6 Expected date:  Expected time:  Means of arrival:  Comments: EMS- back pain  
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
## 2014-12-03 ENCOUNTER — Ambulatory Visit: Payer: Self-pay | Attending: Internal Medicine | Admitting: Internal Medicine

## 2014-12-03 ENCOUNTER — Encounter: Payer: Self-pay | Admitting: Internal Medicine

## 2014-12-03 VITALS — BP 137/94 | HR 77 | Temp 98.4°F | Resp 16 | Ht 60.0 in | Wt 105.0 lb

## 2014-12-03 DIAGNOSIS — Z720 Tobacco use: Secondary | ICD-10-CM

## 2014-12-03 DIAGNOSIS — M545 Low back pain: Secondary | ICD-10-CM | POA: Insufficient documentation

## 2014-12-03 DIAGNOSIS — F17210 Nicotine dependence, cigarettes, uncomplicated: Secondary | ICD-10-CM | POA: Insufficient documentation

## 2014-12-03 DIAGNOSIS — Z79891 Long term (current) use of opiate analgesic: Secondary | ICD-10-CM | POA: Insufficient documentation

## 2014-12-03 DIAGNOSIS — W010XXA Fall on same level from slipping, tripping and stumbling without subsequent striking against object, initial encounter: Secondary | ICD-10-CM | POA: Insufficient documentation

## 2014-12-03 DIAGNOSIS — G8929 Other chronic pain: Secondary | ICD-10-CM | POA: Insufficient documentation

## 2014-12-03 DIAGNOSIS — M549 Dorsalgia, unspecified: Secondary | ICD-10-CM | POA: Insufficient documentation

## 2014-12-03 DIAGNOSIS — F17200 Nicotine dependence, unspecified, uncomplicated: Secondary | ICD-10-CM

## 2014-12-03 NOTE — Patient Instructions (Signed)
Back Pain, Adult Low back pain is very common. About 1 in 5 people have back pain.The cause of low back pain is rarely dangerous. The pain often gets better over time.About half of people with a sudden onset of back pain feel better in just 2 weeks. About 8 in 10 people feel better by 6 weeks.  CAUSES Some common causes of back pain include:  Strain of the muscles or ligaments supporting the spine.  Wear and tear (degeneration) of the spinal discs.  Arthritis.  Direct injury to the back. DIAGNOSIS Most of the time, the direct cause of low back pain is not known.However, back pain can be treated effectively even when the exact cause of the pain is unknown.Answering your caregiver's questions about your overall health and symptoms is one of the most accurate ways to make sure the cause of your pain is not dangerous. If your caregiver needs more information, he or she may order lab work or imaging tests (X-rays or MRIs).However, even if imaging tests show changes in your back, this usually does not require surgery. HOME CARE INSTRUCTIONS For many people, back pain returns.Since low back pain is rarely dangerous, it is often a condition that people can learn to manageon their own.   Remain active. It is stressful on the back to sit or stand in one place. Do not sit, drive, or stand in one place for more than 30 minutes at a time. Take short walks on level surfaces as soon as pain allows.Try to increase the length of time you walk each day.  Do not stay in bed.Resting more than 1 or 2 days can delay your recovery.  Do not avoid exercise or work.Your body is made to move.It is not dangerous to be active, even though your back may hurt.Your back will likely heal faster if you return to being active before your pain is gone.  Pay attention to your body when you bend and lift. Many people have less discomfortwhen lifting if they bend their knees, keep the load close to their bodies,and  avoid twisting. Often, the most comfortable positions are those that put less stress on your recovering back.  Find a comfortable position to sleep. Use a firm mattress and lie on your side with your knees slightly bent. If you lie on your back, put a pillow under your knees.  Only take over-the-counter or prescription medicines as directed by your caregiver. Over-the-counter medicines to reduce pain and inflammation are often the most helpful.Your caregiver may prescribe muscle relaxant drugs.These medicines help dull your pain so you can more quickly return to your normal activities and healthy exercise.  Put ice on the injured area.  Put ice in a plastic bag.  Place a towel between your skin and the bag.  Leave the ice on for 15-20 minutes, 03-04 times a day for the first 2 to 3 days. After that, ice and heat may be alternated to reduce pain and spasms.  Ask your caregiver about trying back exercises and gentle massage. This may be of some benefit.  Avoid feeling anxious or stressed.Stress increases muscle tension and can worsen back pain.It is important to recognize when you are anxious or stressed and learn ways to manage it.Exercise is a great option. SEEK MEDICAL CARE IF:  You have pain that is not relieved with rest or medicine.  You have pain that does not improve in 1 week.  You have new symptoms.  You are generally not feeling well. SEEK   IMMEDIATE MEDICAL CARE IF:   You have pain that radiates from your back into your legs.  You develop new bowel or bladder control problems.  You have unusual weakness or numbness in your arms or legs.  You develop nausea or vomiting.  You develop abdominal pain.  You feel faint. Document Released: 12/17/2005 Document Revised: 06/17/2012 Document Reviewed: 04/20/2014 ExitCare Patient Information 2015 [employer]. This information is not intended to replace advice given to you by your health care provider. Make sure you  discuss any questions you have with your health care provider.  
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
## 2015-06-18 ENCOUNTER — Emergency Department (HOSPITAL_COMMUNITY): Payer: Self-pay

## 2015-06-18 ENCOUNTER — Encounter (HOSPITAL_COMMUNITY): Payer: Self-pay

## 2015-06-18 ENCOUNTER — Emergency Department (HOSPITAL_COMMUNITY)
Admission: EM | Admit: 2015-06-18 | Discharge: 2015-06-18 | Disposition: A | Discharge status: Home / Self Care | Payer: Self-pay | Attending: Emergency Medicine | Admitting: Emergency Medicine

## 2015-06-18 DIAGNOSIS — S99911A Unspecified injury of right ankle, initial encounter: Secondary | ICD-10-CM | POA: Insufficient documentation

## 2015-06-18 DIAGNOSIS — Y92091 Bathroom in other non-institutional residence as the place of occurrence of the external cause: Secondary | ICD-10-CM | POA: Insufficient documentation

## 2015-06-18 DIAGNOSIS — Z720 Tobacco use: Secondary | ICD-10-CM | POA: Insufficient documentation

## 2015-06-18 DIAGNOSIS — Y998 Other external cause status: Secondary | ICD-10-CM | POA: Insufficient documentation

## 2015-06-18 DIAGNOSIS — Y93E1 Activity, personal bathing and showering: Secondary | ICD-10-CM | POA: Insufficient documentation

## 2015-06-18 DIAGNOSIS — S8991XA Unspecified injury of right lower leg, initial encounter: Secondary | ICD-10-CM | POA: Insufficient documentation

## 2015-06-18 DIAGNOSIS — W19XXXA Unspecified fall, initial encounter: Secondary | ICD-10-CM

## 2015-06-18 DIAGNOSIS — W01198A Fall on same level from slipping, tripping and stumbling with subsequent striking against other object, initial encounter: Secondary | ICD-10-CM | POA: Insufficient documentation

## 2015-06-18 DIAGNOSIS — S99921A Unspecified injury of right foot, initial encounter: Secondary | ICD-10-CM | POA: Insufficient documentation

## 2015-06-18 MED ORDER — OXYCODONE HCL 5 MG PO TABS
5.0000 mg | ORAL_TABLET | Freq: Once | ORAL | Status: AC
  Administered 2015-06-18: 5 mg via ORAL
  Filled 2015-06-18: qty 1

## 2015-06-18 NOTE — ED Notes (Signed)
Pt spouse assisted pt to BR and back to room

## 2015-06-18 NOTE — ED Notes (Signed)
Pt sts that she slipped on wet floor this am hitting the stall and floor. Pt c/o R ankle, leg, thigh pain. Pt denies LOC or hitting head. Pt denies R arm pain. Pt is A&O and in NAD.

## 2015-06-18 NOTE — Discharge Instructions (Signed)
Continue your home pain medications.  May wish to ice/elevate your ankle at home to help with pain/swelling. Follow-up with Dr. Linna Caprice-- call to make appt. Return here for new concerns.

## 2015-06-18 NOTE — ED Notes (Signed)
Ortho tech called and sts that his ETA is approx 1 hr

## 2015-06-18 NOTE — ED Notes (Signed)
She states she tripped and fell this morning.  She c/o pain at right ankle/foot/knee and upper lat. Thigh areas.  She is in no distress.

## 2015-06-18 NOTE — ED Notes (Signed)
Ortho paged to place pt splint
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
## 2015-10-24 ENCOUNTER — Encounter (HOSPITAL_COMMUNITY): Payer: Self-pay | Admitting: *Deleted

## 2015-10-24 ENCOUNTER — Emergency Department (HOSPITAL_COMMUNITY)
Admission: EM | Admit: 2015-10-24 | Discharge: 2015-10-24 | Discharge status: Against Medical Advice | Payer: Self-pay | Attending: Emergency Medicine | Admitting: Emergency Medicine

## 2015-10-24 DIAGNOSIS — R1031 Right lower quadrant pain: Secondary | ICD-10-CM | POA: Insufficient documentation

## 2015-10-24 DIAGNOSIS — G8929 Other chronic pain: Secondary | ICD-10-CM | POA: Insufficient documentation

## 2015-10-24 DIAGNOSIS — R112 Nausea with vomiting, unspecified: Secondary | ICD-10-CM | POA: Insufficient documentation

## 2015-10-24 DIAGNOSIS — Z720 Tobacco use: Secondary | ICD-10-CM | POA: Insufficient documentation

## 2015-10-24 HISTORY — DX: Dorsalgia, unspecified: M54.9

## 2015-10-24 HISTORY — DX: Other chronic pain: G89.29

## 2015-10-24 NOTE — ED Notes (Signed)
Delay in lab draw, called pt for blood draw and no answer

## 2015-10-24 NOTE — ED Notes (Signed)
Pt c/o RLQ abd pain x 2 days; pt states that the pain has gotten progressively worse as the day has went on; pt c/o nausea and 1 episode of vomiting; pt denies diarrhea; pt states that she is on pain meds and that it is not touching this pain

## 2015-10-24 NOTE — ED Notes (Signed)
Called pt again for blood draw, no answer.  Notified triage nurse

## 2015-10-24 NOTE — ED Notes (Signed)
Third attempt at calling pt for lab draw. Checked for pt outside, no response.

## 2015-10-24 NOTE — ED Notes (Signed)
Patient called to treatment room, no answer.  

## 2015-10-25 ENCOUNTER — Encounter (HOSPITAL_COMMUNITY): Payer: Self-pay | Admitting: Emergency Medicine

## 2015-10-25 ENCOUNTER — Emergency Department (HOSPITAL_COMMUNITY): Payer: Self-pay

## 2015-10-25 ENCOUNTER — Inpatient Hospital Stay (HOSPITAL_COMMUNITY)
Admission: EM | Admit: 2015-10-25 | Discharge: 2015-10-27 | DRG: 759 | Discharge status: Against Medical Advice | Payer: Self-pay | Attending: General Surgery | Admitting: General Surgery

## 2015-10-25 DIAGNOSIS — F17210 Nicotine dependence, cigarettes, uncomplicated: Secondary | ICD-10-CM | POA: Diagnosis present

## 2015-10-25 DIAGNOSIS — G8929 Other chronic pain: Secondary | ICD-10-CM | POA: Diagnosis present

## 2015-10-25 DIAGNOSIS — Z886 Allergy status to analgesic agent status: Secondary | ICD-10-CM

## 2015-10-25 DIAGNOSIS — M549 Dorsalgia, unspecified: Secondary | ICD-10-CM | POA: Diagnosis present

## 2015-10-25 DIAGNOSIS — Z8249 Family history of ischemic heart disease and other diseases of the circulatory system: Secondary | ICD-10-CM

## 2015-10-25 DIAGNOSIS — D72829 Elevated white blood cell count, unspecified: Secondary | ICD-10-CM | POA: Diagnosis present

## 2015-10-25 DIAGNOSIS — N739 Female pelvic inflammatory disease, unspecified: Principal | ICD-10-CM | POA: Diagnosis present

## 2015-10-25 DIAGNOSIS — Z823 Family history of stroke: Secondary | ICD-10-CM

## 2015-10-25 DIAGNOSIS — Z79899 Other long term (current) drug therapy: Secondary | ICD-10-CM

## 2015-10-25 DIAGNOSIS — K37 Unspecified appendicitis: Secondary | ICD-10-CM | POA: Diagnosis present

## 2015-10-25 DIAGNOSIS — Z79891 Long term (current) use of opiate analgesic: Secondary | ICD-10-CM

## 2015-10-25 DIAGNOSIS — Z888 Allergy status to other drugs, medicaments and biological substances status: Secondary | ICD-10-CM

## 2015-10-25 DIAGNOSIS — J45909 Unspecified asthma, uncomplicated: Secondary | ICD-10-CM | POA: Diagnosis present

## 2015-10-25 LAB — CBC WITH DIFFERENTIAL/PLATELET
BASOS ABS: 0.1 10*3/uL (ref 0.0–0.1)
BASOS PCT: 1 %
Eosinophils Absolute: 0.3 10*3/uL (ref 0.0–0.7)
Eosinophils Relative: 3 %
HEMATOCRIT: 41.9 % (ref 36.0–46.0)
HEMOGLOBIN: 13.9 g/dL (ref 12.0–15.0)
LYMPHS PCT: 19 %
Lymphs Abs: 2 10*3/uL (ref 0.7–4.0)
MCH: 31.8 pg (ref 26.0–34.0)
MCHC: 33.2 g/dL (ref 30.0–36.0)
MCV: 95.9 fL (ref 78.0–100.0)
Monocytes Absolute: 0.9 10*3/uL (ref 0.1–1.0)
Monocytes Relative: 9 %
NEUTROS ABS: 7.5 10*3/uL (ref 1.7–7.7)
NEUTROS PCT: 68 %
Platelets: 277 10*3/uL (ref 150–400)
RBC: 4.37 MIL/uL (ref 3.87–5.11)
RDW: 12.8 % (ref 11.5–15.5)
WBC: 10.8 10*3/uL — AB (ref 4.0–10.5)

## 2015-10-25 LAB — URINALYSIS, ROUTINE W REFLEX MICROSCOPIC
Bilirubin Urine: NEGATIVE
GLUCOSE, UA: NEGATIVE mg/dL
Hgb urine dipstick: NEGATIVE
Ketones, ur: NEGATIVE mg/dL
Nitrite: NEGATIVE
PROTEIN: NEGATIVE mg/dL
Specific Gravity, Urine: 1.01 (ref 1.005–1.030)
UROBILINOGEN UA: 1 mg/dL (ref 0.0–1.0)
pH: 7.5 (ref 5.0–8.0)

## 2015-10-25 LAB — URINE MICROSCOPIC-ADD ON

## 2015-10-25 LAB — WET PREP, GENITAL: Yeast Wet Prep HPF POC: NONE SEEN

## 2015-10-25 LAB — COMPREHENSIVE METABOLIC PANEL
ALBUMIN: 3.4 g/dL — AB (ref 3.5–5.0)
ALT: 16 U/L (ref 14–54)
AST: 20 U/L (ref 15–41)
Alkaline Phosphatase: 57 U/L (ref 38–126)
Anion gap: 7 (ref 5–15)
BILIRUBIN TOTAL: 0.2 mg/dL — AB (ref 0.3–1.2)
BUN: 5 mg/dL — AB (ref 6–20)
CO2: 27 mmol/L (ref 22–32)
CREATININE: 0.91 mg/dL (ref 0.44–1.00)
Calcium: 8.5 mg/dL — ABNORMAL LOW (ref 8.9–10.3)
Chloride: 102 mmol/L (ref 101–111)
GFR calc Af Amer: >60 mL/min (ref >60)
GLUCOSE: 98 mg/dL (ref 65–99)
Potassium: 4.2 mmol/L (ref 3.5–5.1)
Sodium: 136 mmol/L (ref 135–145)
TOTAL PROTEIN: 7 g/dL (ref 6.5–8.1)

## 2015-10-25 LAB — POC URINE PREG, ED: PREG TEST UR: NEGATIVE

## 2015-10-25 MED ORDER — IOHEXOL 300 MG/ML  SOLN
25.0000 mL | Freq: Once | INTRAMUSCULAR | Status: AC | PRN
  Administered 2015-10-25: 25 mL via ORAL

## 2015-10-25 MED ORDER — HYDROMORPHONE HCL 1 MG/ML IJ SOLN
0.5000 mg | Freq: Once | INTRAMUSCULAR | Status: AC
  Administered 2015-10-25: 0.5 mg via INTRAVENOUS
  Filled 2015-10-25: qty 1

## 2015-10-25 MED ORDER — DEXTROSE 5 % IV SOLN
2.0000 g | Freq: Once | INTRAVENOUS | Status: DC
  Filled 2015-10-25: qty 2

## 2015-10-25 MED ORDER — HYDROMORPHONE HCL 1 MG/ML IJ SOLN
1.0000 mg | Freq: Once | INTRAMUSCULAR | Status: AC
  Administered 2015-10-25: 1 mg via INTRAVENOUS
  Filled 2015-10-25: qty 1

## 2015-10-25 MED ORDER — INFLUENZA VAC SPLIT QUAD 0.5 ML IM SUSY: 0.5000 mL | PREFILLED_SYRINGE | INTRAMUSCULAR | Status: DC

## 2015-10-25 MED ORDER — ONDANSETRON HCL 4 MG/2ML IJ SOLN
4.0000 mg | INTRAMUSCULAR | Status: DC | PRN
  Administered 2015-10-25: 4 mg via INTRAVENOUS
  Filled 2015-10-25: qty 2

## 2015-10-25 MED ORDER — ONDANSETRON HCL 4 MG/2ML IJ SOLN
4.0000 mg | Freq: Once | INTRAMUSCULAR | Status: AC
  Administered 2015-10-25: 4 mg via INTRAVENOUS
  Filled 2015-10-25: qty 2

## 2015-10-25 MED ORDER — TEMAZEPAM 15 MG PO CAPS
15.0000 mg | ORAL_CAPSULE | Freq: Every evening | ORAL | Status: DC | PRN
  Administered 2015-10-25: 15 mg via ORAL
  Administered 2015-10-26: 30 mg via ORAL
  Filled 2015-10-25: qty 1
  Filled 2015-10-25: qty 2

## 2015-10-25 MED ORDER — DEXTROSE 5 % IV SOLN
100.0000 mg | Freq: Once | INTRAVENOUS | Status: DC
  Filled 2015-10-25: qty 100

## 2015-10-25 MED ORDER — PIPERACILLIN-TAZOBACTAM 3.375 G IVPB
3.3750 g | Freq: Once | INTRAVENOUS | Status: AC
Start: 2015-10-25 — End: 2015-10-26
  Administered 2015-10-25: 3.375 g via INTRAVENOUS
  Filled 2015-10-25: qty 50

## 2015-10-25 MED ORDER — IOHEXOL 300 MG/ML  SOLN
100.0000 mL | Freq: Once | INTRAMUSCULAR | Status: AC | PRN
  Administered 2015-10-25: 100 mL via INTRAVENOUS

## 2015-10-25 MED ORDER — PNEUMOCOCCAL VAC POLYVALENT 25 MCG/0.5ML IJ INJ: 0.5000 mL | INJECTION | INTRAMUSCULAR | Status: DC

## 2015-10-25 MED ORDER — MORPHINE SULFATE (PF) 2 MG/ML IV SOLN
2.0000 mg | INTRAVENOUS | Status: DC | PRN
  Administered 2015-10-25: 4 mg via INTRAVENOUS
  Administered 2015-10-25: 2 mg via INTRAVENOUS
  Administered 2015-10-26 (×2): 6 mg via INTRAVENOUS
  Administered 2015-10-26 (×2): 4 mg via INTRAVENOUS
  Administered 2015-10-26 (×2): 6 mg via INTRAVENOUS
  Administered 2015-10-26: 4 mg via INTRAVENOUS
  Administered 2015-10-27 (×4): 6 mg via INTRAVENOUS
  Filled 2015-10-25: qty 2
  Filled 2015-10-25 (×6): qty 3
  Filled 2015-10-25: qty 1
  Filled 2015-10-25: qty 2
  Filled 2015-10-25: qty 3
  Filled 2015-10-25 (×2): qty 2
  Filled 2015-10-25: qty 3

## 2015-10-25 MED ORDER — ONDANSETRON 4 MG PO TBDP: 4.0000 mg | ORAL_TABLET | Freq: Four times a day (QID) | ORAL | Status: DC | PRN

## 2015-10-25 MED ORDER — FENTANYL CITRATE (PF) 100 MCG/2ML IJ SOLN: 50.0000 ug | Freq: Once | INTRAMUSCULAR | Status: DC

## 2015-10-25 MED ORDER — KCL IN DEXTROSE-NACL 20-5-0.9 MEQ/L-%-% IV SOLN
INTRAVENOUS | Status: DC
  Administered 2015-10-25 – 2015-10-27 (×3): via INTRAVENOUS
  Filled 2015-10-25 (×6): qty 1000

## 2015-10-25 MED ORDER — PIPERACILLIN-TAZOBACTAM 3.375 G IVPB
3.3750 g | Freq: Three times a day (TID) | INTRAVENOUS | Status: DC
  Administered 2015-10-26 (×3): 3.375 g via INTRAVENOUS
  Filled 2015-10-25 (×5): qty 50
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
## 2015-10-26 LAB — CBC
HCT: 38.3 % (ref 36.0–46.0)
HEMOGLOBIN: 12.4 g/dL (ref 12.0–15.0)
MCH: 31.4 pg (ref 26.0–34.0)
MCHC: 32.4 g/dL (ref 30.0–36.0)
MCV: 97 fL (ref 78.0–100.0)
PLATELETS: 279 10*3/uL (ref 150–400)
RBC: 3.95 MIL/uL (ref 3.87–5.11)
RDW: 12.9 % (ref 11.5–15.5)
WBC: 9.5 10*3/uL (ref 4.0–10.5)

## 2015-10-26 LAB — GC/CHLAMYDIA PROBE AMP (~~LOC~~) NOT AT ARMC
Chlamydia: NEGATIVE
NEISSERIA GONORRHEA: POSITIVE — AB

## 2015-10-26 LAB — HIV ANTIBODY (ROUTINE TESTING W REFLEX): HIV Screen 4th Generation wRfx: NONREACTIVE

## 2015-10-26 LAB — RPR: RPR: NONREACTIVE

## 2015-10-26 MED ORDER — ENOXAPARIN SODIUM 40 MG/0.4ML ~~LOC~~ SOLN
40.0000 mg | Freq: Every day | SUBCUTANEOUS | Status: DC
  Administered 2015-10-26: 40 mg via SUBCUTANEOUS
  Filled 2015-10-26 (×3): qty 0.4

## 2015-10-26 MED ORDER — KETOROLAC TROMETHAMINE 15 MG/ML IJ SOLN
15.0000 mg | Freq: Four times a day (QID) | INTRAMUSCULAR | Status: DC | PRN
  Administered 2015-10-26: 15 mg via INTRAVENOUS
  Filled 2015-10-26: qty 1
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
## 2015-10-26 NOTE — Progress Notes (Signed)
Patient ID: Cheryl Berry, female   DOB: 06/16/1971, 44 y.o.   MRN: [id number] Request received for CT guided drainage of right pelvic abscess in patient. Imaging studies have been reviewed by Dr. Fredia SorrowYamagata. He states that abscess is not safely accessible by percutaneous route at this time. Recommend continued antibiotic therapy and follow-up imaging in few days. Please contact Dr. Fredia SorrowYamagata [phone] with any additional questions or concerns.

## 2015-10-26 NOTE — Progress Notes (Signed)
Pt found smoking cigarettes in the bathroom of her room. She told the nurse she was not, but the smell was very potent. Informed her that this is not allowed and asked the patient to have her significant other take the cigarettes home, since there's no need for them in the hospital.

## 2015-10-26 NOTE — Care Management Note (Signed)
Case Management Note  Patient Details  Name: Carry Weesner MRN: [id number] Date of Birth: 08/30/1971  Subjective/Objective:                  Severe right lower quadrant pain  Action/Plan:  Discharge planning Expected Discharge Date:   (unknown)               Expected Discharge Plan:     In-House Referral:     Discharge planning Services  CM Consult, [facility]  Post Acute Care Choice:    Choice offered to:     DME Arranged:    DME Agency:     HH Arranged:    [place] Agency:     Status of Service:  Completed, signed off  Medicare Important Message Given:    Date Medicare IM Given:    Medicare IM give by:    Date Additional Medicare IM Given:    Additional Medicare Important Message give by:     If discussed at [place] of Stay Meetings, dates discussed:    Additional Comments: Utilization Review complete. CM met with pt in room and gave pt [facility] pamphlet and pt verbalized understanding she will GO to the clinic any weekday morning from 9-10 and ask for AN APPOINTMENT FOR A PRIMARY CARE PHYSICIAN; AN APPOINTMENT WITH A NAVIGATOR TO SECURE INSURANCE; AN APPOINTMENT FOR FOLLOW UP MEDICAL CARE.   CM does not anticipate any new medication needs which cannot be handled by [facility] $4 list but will follow for med asst.   Dellie Catholic, RN 10/26/2015, 10:53 AM
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
## 2015-10-27 ENCOUNTER — Inpatient Hospital Stay (HOSPITAL_COMMUNITY)
Admission: EM | Admit: 2015-10-27 | Discharge: 2015-11-01 | DRG: 757 | Disposition: A | Discharge status: Home / Self Care | Payer: Self-pay | Attending: General Surgery | Admitting: General Surgery

## 2015-10-27 ENCOUNTER — Encounter (HOSPITAL_COMMUNITY): Payer: Self-pay | Admitting: *Deleted

## 2015-10-27 DIAGNOSIS — K352 Acute appendicitis with generalized peritonitis: Secondary | ICD-10-CM | POA: Diagnosis present

## 2015-10-27 DIAGNOSIS — G8929 Other chronic pain: Secondary | ICD-10-CM | POA: Diagnosis present

## 2015-10-27 DIAGNOSIS — A549 Gonococcal infection, unspecified: Secondary | ICD-10-CM | POA: Diagnosis present

## 2015-10-27 DIAGNOSIS — R1013 Epigastric pain: Secondary | ICD-10-CM

## 2015-10-27 DIAGNOSIS — N7093 Salpingitis and oophoritis, unspecified: Principal | ICD-10-CM | POA: Diagnosis present

## 2015-10-27 DIAGNOSIS — Z79891 Long term (current) use of opiate analgesic: Secondary | ICD-10-CM

## 2015-10-27 DIAGNOSIS — F17210 Nicotine dependence, cigarettes, uncomplicated: Secondary | ICD-10-CM | POA: Diagnosis present

## 2015-10-27 DIAGNOSIS — A599 Trichomoniasis, unspecified: Secondary | ICD-10-CM | POA: Diagnosis present

## 2015-10-27 DIAGNOSIS — L0291 Cutaneous abscess, unspecified: Secondary | ICD-10-CM

## 2015-10-27 DIAGNOSIS — N739 Female pelvic inflammatory disease, unspecified: Secondary | ICD-10-CM | POA: Diagnosis present

## 2015-10-27 DIAGNOSIS — Z888 Allergy status to other drugs, medicaments and biological substances status: Secondary | ICD-10-CM

## 2015-10-27 DIAGNOSIS — Z8249 Family history of ischemic heart disease and other diseases of the circulatory system: Secondary | ICD-10-CM

## 2015-10-27 DIAGNOSIS — Z79899 Other long term (current) drug therapy: Secondary | ICD-10-CM

## 2015-10-27 DIAGNOSIS — K651 Peritoneal abscess: Secondary | ICD-10-CM

## 2015-10-27 DIAGNOSIS — Z823 Family history of stroke: Secondary | ICD-10-CM

## 2015-10-27 HISTORY — DX: Trichomoniasis, unspecified: A59.9

## 2015-10-27 HISTORY — DX: Chlamydial female pelvic inflammatory disease: A56.11

## 2015-10-27 HISTORY — DX: Gonococcal infection, unspecified: A54.9

## 2015-10-27 HISTORY — DX: Epigastric pain: R10.13

## 2015-10-27 HISTORY — DX: Other chronic pain: G89.29

## 2015-10-27 HISTORY — DX: Salpingitis and oophoritis, unspecified: N70.93

## 2015-10-27 MED ORDER — DIPHENHYDRAMINE HCL 12.5 MG/5ML PO ELIX: 12.5000 mg | ORAL_SOLUTION | Freq: Four times a day (QID) | ORAL | Status: DC | PRN

## 2015-10-27 MED ORDER — ENOXAPARIN SODIUM 40 MG/0.4ML ~~LOC~~ SOLN: 40.0000 mg | SUBCUTANEOUS | Status: DC

## 2015-10-27 MED ORDER — DIPHENHYDRAMINE HCL 50 MG/ML IJ SOLN: 12.5000 mg | Freq: Four times a day (QID) | INTRAMUSCULAR | Status: DC | PRN

## 2015-10-27 MED ORDER — PNEUMOCOCCAL VAC POLYVALENT 25 MCG/0.5ML IJ INJ: 0.5000 mL | INJECTION | INTRAMUSCULAR | Status: DC | PRN

## 2015-10-27 MED ORDER — TEMAZEPAM 15 MG PO CAPS
15.0000 mg | ORAL_CAPSULE | Freq: Every evening | ORAL | Status: DC | PRN
  Administered 2015-10-28 – 2015-10-31 (×4): 15 mg via ORAL
  Filled 2015-10-27 (×4): qty 1

## 2015-10-27 MED ORDER — ENOXAPARIN SODIUM 40 MG/0.4ML ~~LOC~~ SOLN
40.0000 mg | SUBCUTANEOUS | Status: DC
  Administered 2015-10-27 – 2015-10-31 (×5): 40 mg via SUBCUTANEOUS
  Filled 2015-10-27 (×6): qty 0.4

## 2015-10-27 MED ORDER — PIPERACILLIN-TAZOBACTAM 3.375 G IVPB
3.3750 g | Freq: Three times a day (TID) | INTRAVENOUS | Status: DC
  Administered 2015-10-27 – 2015-11-01 (×15): 3.375 g via INTRAVENOUS
  Filled 2015-10-27 (×16): qty 50

## 2015-10-27 MED ORDER — NICOTINE 14 MG/24HR TD PT24
14.0000 mg | MEDICATED_PATCH | Freq: Every day | TRANSDERMAL | Status: DC
  Administered 2015-10-27 – 2015-10-31 (×5): 14 mg via TRANSDERMAL
  Filled 2015-10-27 (×6): qty 1

## 2015-10-27 MED ORDER — LORAZEPAM 0.5 MG PO TABS: 0.2500 mg | ORAL_TABLET | Freq: Four times a day (QID) | ORAL | Status: DC | PRN

## 2015-10-27 MED ORDER — POTASSIUM CHLORIDE IN NACL 20-0.9 MEQ/L-% IV SOLN
INTRAVENOUS | Status: DC
  Administered 2015-10-27: 100 mL/h via INTRAVENOUS
  Administered 2015-10-27 – 2015-10-28 (×2): via INTRAVENOUS
  Administered 2015-10-28: 100 mL/h via INTRAVENOUS
  Administered 2015-10-29: 100 mL via INTRAVENOUS
  Administered 2015-10-30 (×3): via INTRAVENOUS
  Administered 2015-10-31: 100 mL/h via INTRAVENOUS
  Administered 2015-11-01: 03:00:00 via INTRAVENOUS
  Filled 2015-10-27 (×15): qty 1000

## 2015-10-27 MED ORDER — MORPHINE SULFATE (PF) 2 MG/ML IV SOLN
2.0000 mg | INTRAVENOUS | Status: DC | PRN
  Administered 2015-10-27 (×3): 4 mg via INTRAVENOUS
  Administered 2015-10-28 (×3): 2 mg via INTRAVENOUS
  Administered 2015-10-28: 4 mg via INTRAVENOUS
  Administered 2015-10-28 – 2015-10-30 (×8): 2 mg via INTRAVENOUS
  Administered 2015-10-30 (×3): 4 mg via INTRAVENOUS
  Administered 2015-10-30: 2 mg via INTRAVENOUS
  Administered 2015-10-31 (×2): 4 mg via INTRAVENOUS
  Administered 2015-10-31: 2 mg via INTRAVENOUS
  Administered 2015-10-31 (×3): 4 mg via INTRAVENOUS
  Administered 2015-10-31: 2 mg via INTRAVENOUS
  Administered 2015-10-31 – 2015-11-01 (×4): 4 mg via INTRAVENOUS
  Filled 2015-10-27 (×3): qty 2
  Filled 2015-10-27 (×3): qty 1
  Filled 2015-10-27: qty 2
  Filled 2015-10-27: qty 1
  Filled 2015-10-27 (×3): qty 2
  Filled 2015-10-27: qty 1
  Filled 2015-10-27 (×2): qty 2
  Filled 2015-10-27 (×2): qty 1
  Filled 2015-10-27: qty 2
  Filled 2015-10-27: qty 1
  Filled 2015-10-27 (×4): qty 2
  Filled 2015-10-27: qty 1
  Filled 2015-10-27: qty 2
  Filled 2015-10-27 (×3): qty 1
  Filled 2015-10-27 (×2): qty 2
  Filled 2015-10-27 (×2): qty 1

## 2015-10-27 MED ORDER — INFLUENZA VAC SPLIT QUAD 0.5 ML IM SUSY: 0.5000 mL | PREFILLED_SYRINGE | INTRAMUSCULAR | Status: DC | PRN

## 2015-10-27 MED ORDER — ONDANSETRON HCL 4 MG/2ML IJ SOLN
4.0000 mg | Freq: Four times a day (QID) | INTRAMUSCULAR | Status: DC | PRN
  Administered 2015-10-28 – 2015-10-30 (×6): 4 mg via INTRAVENOUS
  Filled 2015-10-27 (×6): qty 2

## 2015-10-27 MED ORDER — OXYCODONE HCL 5 MG PO TABS
10.0000 mg | ORAL_TABLET | Freq: Four times a day (QID) | ORAL | Status: DC | PRN
  Administered 2015-10-27 – 2015-10-30 (×10): 10 mg via ORAL
  Filled 2015-10-27 (×10): qty 2

## 2015-10-27 MED ORDER — IBUPROFEN 200 MG PO TABS: 600.0000 mg | ORAL_TABLET | Freq: Four times a day (QID) | ORAL | Status: DC | PRN

## 2015-10-27 MED ORDER — SENNOSIDES-DOCUSATE SODIUM 8.6-50 MG PO TABS: 1.0000 | ORAL_TABLET | Freq: Every evening | ORAL | Status: DC | PRN

## 2015-10-27 MED ORDER — ONDANSETRON 4 MG PO TBDP
4.0000 mg | ORAL_TABLET | Freq: Four times a day (QID) | ORAL | Status: DC | PRN
  Filled 2015-10-27: qty 1
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
## 2015-10-27 NOTE — Progress Notes (Signed)
Patient left AMA. Papers were signed with this nurse witnessing. Patient wanted to leave the unit whenever she wanted to go downstairs and smoke. Patient was educated on the reasons why she could not do this. Offered nicotine patch but patient refused. She was also upset that a surgeon had not been in to see her yet today.
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
## 2015-10-27 NOTE — ED Notes (Signed)
Pt reports she was admitted earlier this week for abdominal infection, abscess in RLQ, abd so swollen that they are unable to drain the infection out. Pt was admitted to floor and 1 hour ago "got fed up" and left AMA. Pt decided to come back because of the pain. Pain 10/10.

## 2015-10-28 LAB — CBC
HEMATOCRIT: 39.9 % (ref 36.0–46.0)
Hemoglobin: 13.3 g/dL (ref 12.0–15.0)
MCH: 31.7 pg (ref 26.0–34.0)
MCHC: 33.3 g/dL (ref 30.0–36.0)
MCV: 95.2 fL (ref 78.0–100.0)
Platelets: 325 10*3/uL (ref 150–400)
RBC: 4.19 MIL/uL (ref 3.87–5.11)
RDW: 12.5 % (ref 11.5–15.5)
WBC: 8.1 10*3/uL (ref 4.0–10.5)

## 2015-10-28 LAB — BASIC METABOLIC PANEL
Anion gap: 10 (ref 5–15)
BUN: 7 mg/dL (ref 6–20)
CHLORIDE: 107 mmol/L (ref 101–111)
CO2: 23 mmol/L (ref 22–32)
Calcium: 8.5 mg/dL — ABNORMAL LOW (ref 8.9–10.3)
Creatinine, Ser: 0.73 mg/dL (ref 0.44–1.00)
GFR calc Af Amer: >60 mL/min (ref >60)
GFR calc non Af Amer: >60 mL/min (ref >60)
Glucose, Bld: 83 mg/dL (ref 65–99)
POTASSIUM: 4.4 mmol/L (ref 3.5–5.1)
SODIUM: 140 mmol/L (ref 135–145)

## 2015-10-28 MED ORDER — METRONIDAZOLE 500 MG PO TABS
500.0000 mg | ORAL_TABLET | Freq: Two times a day (BID) | ORAL | Status: DC
  Administered 2015-10-28 – 2015-10-31 (×8): 500 mg via ORAL
  Filled 2015-10-28 (×11): qty 1

## 2015-10-28 MED ORDER — CEFTRIAXONE SODIUM 250 MG IJ SOLR
250.0000 mg | INTRAMUSCULAR | Status: DC
  Administered 2015-10-28: 250 mg via INTRAMUSCULAR
  Filled 2015-10-28 (×3): qty 250

## 2015-10-28 MED ORDER — AZITHROMYCIN 500 MG PO TABS
1000.0000 mg | ORAL_TABLET | Freq: Once | ORAL | Status: AC
  Administered 2015-10-28: 1000 mg via ORAL
  Filled 2015-10-28: qty 2

## 2015-10-28 NOTE — Progress Notes (Signed)
[facility] Stanford BreedBracey, Raedyn Klinck N RN 11:33 AM 10-28-02016
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
## 2015-10-30 ENCOUNTER — Encounter (HOSPITAL_COMMUNITY): Payer: Self-pay | Admitting: Radiology

## 2015-10-30 ENCOUNTER — Inpatient Hospital Stay (HOSPITAL_COMMUNITY): Payer: Self-pay

## 2015-10-30 LAB — CBC WITH DIFFERENTIAL/PLATELET
BASOS ABS: 0 10*3/uL (ref 0.0–0.1)
BASOS PCT: 1 %
EOS ABS: 0.2 10*3/uL (ref 0.0–0.7)
EOS PCT: 4 %
HEMATOCRIT: 36.8 % (ref 36.0–46.0)
Hemoglobin: 12.2 g/dL (ref 12.0–15.0)
Lymphocytes Relative: 28 %
Lymphs Abs: 1.7 10*3/uL (ref 0.7–4.0)
MCH: 31.3 pg (ref 26.0–34.0)
MCHC: 33.2 g/dL (ref 30.0–36.0)
MCV: 94.4 fL (ref 78.0–100.0)
MONO ABS: 0.4 10*3/uL (ref 0.1–1.0)
Monocytes Relative: 6 %
NEUTROS ABS: 3.7 10*3/uL (ref 1.7–7.7)
Neutrophils Relative %: 61 %
PLATELETS: 317 10*3/uL (ref 150–400)
RBC: 3.9 MIL/uL (ref 3.87–5.11)
RDW: 12.6 % (ref 11.5–15.5)
WBC: 6.1 10*3/uL (ref 4.0–10.5)

## 2015-10-30 MED ORDER — IOHEXOL 300 MG/ML  SOLN
50.0000 mL | Freq: Once | INTRAMUSCULAR | Status: AC | PRN
  Administered 2015-10-30: 25 mL via ORAL

## 2015-10-30 MED ORDER — IOHEXOL 300 MG/ML  SOLN
100.0000 mL | Freq: Once | INTRAMUSCULAR | Status: AC | PRN
  Administered 2015-10-30: 100 mL via INTRAVENOUS

## 2015-10-30 NOTE — Progress Notes (Signed)
  Subjective: More pain today, some nausea, having flatus/bm  Objective: Vital signs in last 24 hours: Temp:  [97.5 F (36.4 C)-98.4 F (36.9 C)] 97.9 F (36.6 C) (10/30 0600) Pulse Rate:  [80] 80 (10/30 0600) Resp:  [16-18] 16 (10/30 0600) BP: (89-156)/(61-95) 156/95 mmHg (10/30 0600) SpO2:  [98 %-100 %] 100 % (10/30 0600) Last BM Date: 10/29/15  Intake/Output from previous day: 10/29 0701 - 10/30 0700 In: 3770 [P.O.:240; I.V.:3530] Out: -  Intake/Output this shift:    GI: tender bilateral lq, healed midline incision, some bs  Lab Results:   Recent Labs  10/28/15 0415 10/30/15 0520  WBC 8.1 6.1  HGB 13.3 12.2  HCT 39.9 36.8  PLT 325 317   BMET  Recent Labs  10/28/15 0415  NA 140  K 4.4  CL 107  CO2 23  GLUCOSE 83  BUN 7  CREATININE 0.73  CALCIUM 8.5*   PT/INR No results for input(s): LABPROT, INR in the last 72 hours. ABG No results for input(s): PHART, HCO3 in the last 72 hours.  Invalid input(s): PCO2, PO2  Studies/Results: No results found.  Anti-infectives: Anti-infectives    Start     Dose/Rate Route Frequency Ordered Stop   10/28/15 1000  cefTRIAXone (ROCEPHIN) injection 250 mg  Status:  Discontinued     250 mg Intramuscular Every 24 hours 10/28/15 0830 10/28/15 1413   10/28/15 1000  azithromycin (ZITHROMAX) tablet 1,000 mg     1,000 mg Oral  Once 10/28/15 0830 10/28/15 1103   10/28/15 1000  metroNIDAZOLE (FLAGYL) tablet 500 mg     500 mg Oral Every 12 hours 10/28/15 0830 11/04/15 0959   10/27/15 1130  piperacillin-tazobactam (ZOSYN) IVPB 3.375 g     3.375 g 12.5 mL/hr over 240 Minutes Intravenous 3 times per day 10/27/15 1127        Assessment/Plan: Right pelvis abscess, appendicitis (although this may have been remove) vs toa  Consider gyn consult Repeat ct today due to more pain, may need drain placed     [facility] CenterWAKEFIELD,Emilliano Dilworth 10/30/2015
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
## 2015-10-31 NOTE — Progress Notes (Signed)
Medical records contacted to assist in locating medical records from [facility]6 2009. Medical release form sent to them with request.

## 2015-11-01 ENCOUNTER — Encounter (HOSPITAL_COMMUNITY): Payer: Self-pay | Admitting: General Surgery

## 2015-11-01 DIAGNOSIS — R1013 Epigastric pain: Secondary | ICD-10-CM

## 2015-11-01 DIAGNOSIS — A549 Gonococcal infection, unspecified: Secondary | ICD-10-CM

## 2015-11-01 DIAGNOSIS — G8929 Other chronic pain: Secondary | ICD-10-CM

## 2015-11-01 DIAGNOSIS — A5611 Chlamydial female pelvic inflammatory disease: Secondary | ICD-10-CM

## 2015-11-01 DIAGNOSIS — A599 Trichomoniasis, unspecified: Secondary | ICD-10-CM

## 2015-11-01 DIAGNOSIS — N7093 Salpingitis and oophoritis, unspecified: Secondary | ICD-10-CM

## 2015-11-01 HISTORY — DX: Chlamydial female pelvic inflammatory disease: A56.11

## 2015-11-01 HISTORY — DX: Salpingitis and oophoritis, unspecified: N70.93

## 2015-11-01 HISTORY — DX: Gonococcal infection, unspecified: A54.9

## 2015-11-01 HISTORY — DX: Other chronic pain: G89.29

## 2015-11-01 HISTORY — DX: Trichomoniasis, unspecified: A59.9

## 2015-11-01 MED ORDER — METRONIDAZOLE 500 MG PO TABS: 500.0000 mg | ORAL_TABLET | Freq: Three times a day (TID) | ORAL | Status: DC

## 2015-11-01 MED ORDER — SACCHAROMYCES BOULARDII 250 MG PO CAPS
250.0000 mg | ORAL_CAPSULE | Freq: Two times a day (BID) | ORAL | Status: DC
  Filled 2015-11-01 (×2): qty 1

## 2015-11-01 MED ORDER — IBUPROFEN 200 MG PO TABS: ORAL_TABLET | ORAL | Status: AC

## 2015-11-01 MED ORDER — CIPROFLOXACIN HCL 500 MG PO TABS
500.0000 mg | ORAL_TABLET | Freq: Two times a day (BID) | ORAL | Status: DC
  Filled 2015-11-01 (×2): qty 1

## 2015-11-01 MED ORDER — CIPROFLOXACIN HCL 500 MG PO TABS: 500.0000 mg | ORAL_TABLET | Freq: Two times a day (BID) | ORAL | Status: AC

## 2015-11-01 MED ORDER — SACCHAROMYCES BOULARDII 250 MG PO CAPS: ORAL_CAPSULE | ORAL | Status: AC

## 2015-11-01 MED ORDER — CIPROFLOXACIN HCL 500 MG PO TABS: 500.0000 mg | ORAL_TABLET | Freq: Two times a day (BID) | ORAL | Status: DC
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
## 2015-11-01 NOTE — Progress Notes (Signed)
Patient's vital signs stable, tolerating regular diet, walking in halls.  Discharge instructions / prescriptions reviewed and given to patient.  Patient discharged to home.

## 2015-11-01 NOTE — Discharge Instructions (Signed)
Tobacco Use Disorder Tobacco use disorder (TUD) is a mental disorder. It is the long-term use of tobacco in spite of related health problems or difficulty with normal life activities. Tobacco is most commonly smoked as cigarettes and less commonly as cigars or pipes. Smokeless chewing tobacco and snuff are also popular. People with TUD get a feeling of extreme pleasure (euphoria) from using tobacco and have a desire to use it again and again. Repeated use of tobacco can cause problems. The addictive effects of tobacco are due mainly tothe ingredient nicotine. Nicotine also causes a rush of adrenaline (epinephrine) in the body. This leads to increased blood pressure, heart rate, and breathing rate. These changes may cause problems for people with high blood pressure, weak hearts, or lung disease. High doses of nicotine in children and pets can lead to seizures and death.  Tobacco contains a number of other unsafe chemicals. These chemicals are especially harmful when inhaled as smoke and can damage almost every organ in the body. Smokers live shorter lives than nonsmokers and are at risk of dying from a number of diseases and cancers. Tobacco smoke can also cause health problems for nonsmokers (due to inhaling secondhand smoke). Smoking is also a fire hazard.  TUD usually starts in the late teenage years and is most common in young adults between the ages of 18 and 25 years. People who start smoking earlier in life are more likely to continue smoking as adults. TUD is somewhat more common in men than women. People with TUD are at higher risk for using alcohol and other drugs of abuse. RISK FACTORS Risk factors for TUD include:   Having family members with the disorder.  Being around people who use tobacco.  Having an existing mental health issue such as schizophrenia, depression, bipolar disorder, ADHD, or posttraumatic stress disorder (PTSD). SIGNS AND SYMPTOMS  People with tobacco use disorder have  two or more of the following signs and symptoms within 12 months:   Use of more tobacco over a longer period than intended.   Not able to cut down or control tobacco use.   A lot of time spent obtaining or using tobacco.   Strong desire or urge to use tobacco (craving). Cravings may last for 6 months or longer after quitting.  Use of tobacco even when use leads to major problems at work, school, or home.   Use of tobacco even when use leads to relationship problems.   Giving up or cutting down on important life activities because of tobacco use.   Repeatedly using tobacco in situations where it puts you or others in physical danger, like smoking in bed.   Use of tobacco even when it is known that a physical or mental problem is likely related to tobacco use.   Physical problems are numerous and may include chronic bronchitis, emphysema, lung and other cancers, gum disease, high blood pressure, heart disease, and stroke.   Mental problems caused by tobacco may include difficulty sleeping and anxiety.  Need to use greater amounts of tobacco to get the same effect. This means you have developed a tolerance.   Withdrawal symptoms as a result of stopping or rapidly cutting back use. These symptoms may last a month or more after quitting and include the following:   Depressed, anxious, or irritable mood.   Difficulty concentrating.   Increased appetite.  Restlessness or trouble sleeping.   Use of tobacco to avoid withdrawal symptoms. DIAGNOSIS  Tobacco use disorder is diagnosed by  your health care provider. A diagnosis may be made by:  Your health care provider asking questions about your tobacco use and any problems it may be causing.  A physical exam.  Lab tests.  You may be referred to a mental health professional or addiction specialist. The severity of tobacco use disorder depends on the number of signs and symptoms you have:   Mild--Two or three  symptoms.  Moderate--Four or five symptoms.   Severe--Six or more symptoms.  TREATMENT  Many people with tobacco use disorder are unable to quit on their own and need help. Treatment options include the following:  Nicotine replacement therapy (NRT). NRT provides nicotine without the other harmful chemicals in tobacco. NRT gradually lowers the dosage of nicotine in the body and reduces withdrawal symptoms. NRT is available in over-the-counter forms (gum, lozenges, and skin patches) as well as prescription forms (mouth inhaler and nasal spray).  Medicines.This may include:  Antidepressant medicine that may reduce nicotine cravings.  A medicine that acts on nicotine receptors in the brain to reduce cravings and withdrawal symptoms. It may also block the effects of tobacco in people with TUD who relapse.  Counseling or talk therapy. A form of talk therapy called behavioral therapy is commonly used to treat people with TUD. Behavioral therapy looks at triggers for tobacco use, how to avoid them, and how to cope with cravings. It is most effective in person or by phone but is also available in self-help forms (books and Internet websites).  Support groups. These provide emotional support, advice, and guidance for quitting tobacco. The most effective treatment for TUD is usually a combination of medicine, talk therapy, and support groups. HOME CARE INSTRUCTIONS  Keep all follow-up visits as directed by your health care provider. This is important.  Take medicines only as directed by your health care provider.  Check with your health care provider before starting new prescription or over-the-counter medicines. SEEK MEDICAL CARE IF:  You are not able to take your medicines as prescribed.  Treatment is not helping your TUD and your symptoms get worse. SEEK IMMEDIATE MEDICAL CARE IF:  You have serious thoughts about hurting yourself or others.  You have trouble breathing, chest pain,  sudden weakness, or sudden numbness in part of your body.   This information is not intended to replace advice given to you by your health care provider. Make sure you discuss any questions you have with your health care provider.   Document Released: 08/22/2004 Document Revised: 01/07/2015 Document Reviewed: 02/12/2014 Elsevier Interactive Patient Education 2016 [employer] Abscess An abscess is an infected area that contains a collection of pus and debris.It can occur in almost any part of the body. An abscess is also known as a furuncle or boil. CAUSES  An abscess occurs when tissue gets infected. This can occur from blockage of oil or sweat glands, infection of hair follicles, or a minor injury to the skin. As the body tries to fight the infection, pus collects in the area and creates pressure under the skin. This pressure causes pain. People with weakened immune systems have difficulty fighting infections and get certain abscesses more often.  SYMPTOMS Usually an abscess develops on the skin and becomes a painful mass that is red, warm, and tender. If the abscess forms under the skin, you may feel a moveable soft area under the skin. Some abscesses break open (rupture) on their own, but most will continue to get worse without care. The infection can spread  deeper into the body and eventually into the bloodstream, causing you to feel ill.  DIAGNOSIS  Your caregiver will take your medical history and perform a physical exam. A sample of fluid may also be taken from the abscess to determine what is causing your infection. TREATMENT  Your caregiver may prescribe antibiotic medicines to fight the infection. However, taking antibiotics alone usually does not cure an abscess. Your caregiver may need to make a small cut (incision) in the abscess to drain the pus. In some cases, gauze is packed into the abscess to reduce pain and to continue draining the area. HOME CARE INSTRUCTIONS   Only take  over-the-counter or prescription medicines for pain, discomfort, or fever as directed by your caregiver.  If you were prescribed antibiotics, take them as directed. Finish them even if you start to feel better.  If gauze is used, follow your caregiver's directions for changing the gauze.  To avoid spreading the infection:  Keep your draining abscess covered with a bandage.  Wash your hands well.  Do not share personal care items, towels, or whirlpools with others.  Avoid skin contact with others.  Keep your skin and clothes clean around the abscess.  Keep all follow-up appointments as directed by your caregiver. SEEK MEDICAL CARE IF:   You have increased pain, swelling, redness, fluid drainage, or bleeding.  You have muscle aches, chills, or a general ill feeling.  You have a fever. MAKE SURE YOU:   Understand these instructions.  Will watch your condition.  Will get help right away if you are not doing well or get worse.   This information is not intended to replace advice given to you by your health care provider. Make sure you discuss any questions you have with your health care provider.   Document Released: 09/26/2005 Document Revised: 06/17/2012 Document Reviewed: 02/29/2012 Elsevier Interactive Patient Education [employer]
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
## 2017-03-14 ENCOUNTER — Encounter (HOSPITAL_COMMUNITY): Payer: Self-pay | Admitting: Emergency Medicine

## 2017-03-14 ENCOUNTER — Emergency Department (HOSPITAL_COMMUNITY)
Admission: EM | Admit: 2017-03-14 | Discharge: 2017-03-14 | Disposition: A | Discharge status: Home / Self Care | Payer: Self-pay | Attending: Emergency Medicine | Admitting: Emergency Medicine

## 2017-03-14 ENCOUNTER — Emergency Department (HOSPITAL_COMMUNITY): Payer: Self-pay

## 2017-03-14 DIAGNOSIS — Z79899 Other long term (current) drug therapy: Secondary | ICD-10-CM | POA: Insufficient documentation

## 2017-03-14 DIAGNOSIS — R1031 Right lower quadrant pain: Secondary | ICD-10-CM

## 2017-03-14 DIAGNOSIS — B9689 Other specified bacterial agents as the cause of diseases classified elsewhere: Secondary | ICD-10-CM | POA: Insufficient documentation

## 2017-03-14 DIAGNOSIS — F17210 Nicotine dependence, cigarettes, uncomplicated: Secondary | ICD-10-CM | POA: Insufficient documentation

## 2017-03-14 DIAGNOSIS — N760 Acute vaginitis: Secondary | ICD-10-CM | POA: Insufficient documentation

## 2017-03-14 LAB — RAPID HIV SCREEN (HIV 1/2 AB+AG)
HIV 1/2 Antibodies: NONREACTIVE
HIV-1 P24 ANTIGEN - HIV24: NONREACTIVE

## 2017-03-14 LAB — URINALYSIS, ROUTINE W REFLEX MICROSCOPIC
BACTERIA UA: NONE SEEN
Bilirubin Urine: NEGATIVE
GLUCOSE, UA: NEGATIVE mg/dL
KETONES UR: 5 mg/dL — AB
Leukocytes, UA: NEGATIVE
Nitrite: NEGATIVE
Protein, ur: NEGATIVE mg/dL
Specific Gravity, Urine: 1.023 (ref 1.005–1.030)
pH: 5 (ref 5.0–8.0)

## 2017-03-14 LAB — CBC
HEMATOCRIT: 39.8 % (ref 36.0–46.0)
Hemoglobin: 14.1 g/dL (ref 12.0–15.0)
MCH: 32.9 pg (ref 26.0–34.0)
MCHC: 35.4 g/dL (ref 30.0–36.0)
MCV: 92.8 fL (ref 78.0–100.0)
Platelets: 257 10*3/uL (ref 150–400)
RBC: 4.29 MIL/uL (ref 3.87–5.11)
RDW: 12.3 % (ref 11.5–15.5)
WBC: 9.4 10*3/uL (ref 4.0–10.5)

## 2017-03-14 LAB — WET PREP, GENITAL
SPERM: NONE SEEN
TRICH WET PREP: NONE SEEN
Yeast Wet Prep HPF POC: NONE SEEN

## 2017-03-14 LAB — COMPREHENSIVE METABOLIC PANEL
ALT: 10 U/L — ABNORMAL LOW (ref 14–54)
ANION GAP: 8 (ref 5–15)
AST: 14 U/L — ABNORMAL LOW (ref 15–41)
Albumin: 3.8 g/dL (ref 3.5–5.0)
Alkaline Phosphatase: 37 U/L — ABNORMAL LOW (ref 38–126)
BUN: 10 mg/dL (ref 6–20)
CHLORIDE: 103 mmol/L (ref 101–111)
CO2: 26 mmol/L (ref 22–32)
Calcium: 8.7 mg/dL — ABNORMAL LOW (ref 8.9–10.3)
Creatinine, Ser: 0.76 mg/dL (ref 0.44–1.00)
GFR calc non Af Amer: >60 mL/min (ref >60)
GLUCOSE: 118 mg/dL — AB (ref 65–99)
Potassium: 3 mmol/L — ABNORMAL LOW (ref 3.5–5.1)
SODIUM: 137 mmol/L (ref 135–145)
Total Bilirubin: 0.3 mg/dL (ref 0.3–1.2)
Total Protein: 6.5 g/dL (ref 6.5–8.1)

## 2017-03-14 LAB — LIPASE, BLOOD: LIPASE: 20 U/L (ref 11–51)

## 2017-03-14 LAB — PREGNANCY, URINE: Preg Test, Ur: NEGATIVE

## 2017-03-14 LAB — POC OCCULT BLOOD, ED: FECAL OCCULT BLD: POSITIVE — AB

## 2017-03-14 MED ORDER — IOPAMIDOL (ISOVUE-300) INJECTION 61%
75.0000 mL | Freq: Once | INTRAVENOUS | Status: AC | PRN
  Administered 2017-03-14: 75 mL via INTRAVENOUS

## 2017-03-14 MED ORDER — SODIUM CHLORIDE 0.9 % IV BOLUS (SEPSIS)
1000.0000 mL | Freq: Once | INTRAVENOUS | Status: AC
  Administered 2017-03-14: 1000 mL via INTRAVENOUS

## 2017-03-14 MED ORDER — IOPAMIDOL (ISOVUE-300) INJECTION 61%
INTRAVENOUS | Status: AC
  Administered 2017-03-14: 30 mL via ORAL
  Filled 2017-03-14: qty 30

## 2017-03-14 MED ORDER — IOPAMIDOL (ISOVUE-300) INJECTION 61%
INTRAVENOUS | Status: AC
  Filled 2017-03-14: qty 75

## 2017-03-14 MED ORDER — METRONIDAZOLE 500 MG PO TABS: 500.0000 mg | ORAL_TABLET | Freq: Two times a day (BID) | ORAL | 0 refills | Status: AC

## 2017-03-14 MED ORDER — KETOROLAC TROMETHAMINE 30 MG/ML IJ SOLN
30.0000 mg | Freq: Once | INTRAMUSCULAR | Status: AC
  Administered 2017-03-14: 30 mg via INTRAVENOUS
  Filled 2017-03-14: qty 1

## 2017-03-14 MED ORDER — IOPAMIDOL (ISOVUE-300) INJECTION 61%
30.0000 mL | Freq: Once | INTRAVENOUS | Status: AC | PRN
  Administered 2017-03-14: 30 mL via ORAL

## 2017-03-14 MED ORDER — MORPHINE SULFATE (PF) 4 MG/ML IV SOLN
4.0000 mg | Freq: Once | INTRAVENOUS | Status: AC
  Administered 2017-03-14: 4 mg via INTRAVENOUS
  Filled 2017-03-14: qty 1

## 2017-03-14 MED ORDER — ONDANSETRON HCL 4 MG/2ML IJ SOLN
4.0000 mg | Freq: Once | INTRAMUSCULAR | Status: AC
  Administered 2017-03-14: 4 mg via INTRAVENOUS
  Filled 2017-03-14: qty 2

## 2017-03-14 MED ORDER — DICYCLOMINE HCL 20 MG PO TABS: 20.0000 mg | ORAL_TABLET | Freq: Two times a day (BID) | ORAL | 0 refills | Status: AC
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
## 2017-03-14 NOTE — Discharge Instructions (Signed)
The lab work and imaging we did today was overall reassuring. You were found to have bacterial vaginosis, please read the attached information on this. Please take Flagyl as prescribed. You have been prescribed Bentyl for lower abdominal pain. There were no signs of abscess or appendicitis on your CT scan. We recommend further workup with a gastroenterologist as an outpatient for what appears to be chronic right lower abdominal pain. Please call GI clinic as soon as possible for reevaluation and discussion of your visit today. Return to the emergency department if your symptoms worsen, if your right lower abdominal pain is associated with nausea, vomiting, fever.  Your HIV test was negative, your syphilis, gonorrhea, chlamydia testing are not yet back.  You denied recent sexual activity and vaginal symptoms, so you were NOT treated today for these STDs.  Please return for treatment if you notice vaginal irritation, discharge, or any other symptoms.

## 2017-03-14 NOTE — ED Triage Notes (Signed)
Patient c/o 1 episode of dark red blood in her stool and 3 episodes of dark red blood in the urine in two weeks. Patient also reports RLQ pain. Denies N/V/D. Ambulatory to triage.

## 2017-03-15 LAB — URINE CULTURE: Culture: NO GROWTH

## 2017-03-15 LAB — SYPHILIS: RPR W/REFLEX TO RPR TITER AND TREPONEMAL ANTIBODIES, TRADITIONAL SCREENING AND DIAGNOSIS ALGORITHM: RPR Ser Ql: NONREACTIVE

## 2017-03-16 LAB — GC/CHLAMYDIA PROBE AMP (~~LOC~~) NOT AT ARMC
Chlamydia: NEGATIVE
NEISSERIA GONORRHEA: NEGATIVE

## 2017-08-01 ENCOUNTER — Emergency Department (HOSPITAL_COMMUNITY): Payer: Self-pay

## 2017-08-01 ENCOUNTER — Encounter (HOSPITAL_COMMUNITY): Payer: Self-pay | Admitting: *Deleted

## 2017-08-01 ENCOUNTER — Emergency Department (HOSPITAL_COMMUNITY)
Admission: EM | Admit: 2017-08-01 | Discharge: 2017-08-02 | Disposition: A | Discharge status: Home / Self Care | Payer: Self-pay | Attending: Physician Assistant | Admitting: Physician Assistant

## 2017-08-01 DIAGNOSIS — Z79899 Other long term (current) drug therapy: Secondary | ICD-10-CM | POA: Insufficient documentation

## 2017-08-01 DIAGNOSIS — F17210 Nicotine dependence, cigarettes, uncomplicated: Secondary | ICD-10-CM | POA: Insufficient documentation

## 2017-08-01 DIAGNOSIS — N12 Tubulo-interstitial nephritis, not specified as acute or chronic: Secondary | ICD-10-CM | POA: Insufficient documentation

## 2017-08-01 LAB — URINALYSIS, ROUTINE W REFLEX MICROSCOPIC
BILIRUBIN URINE: NEGATIVE
Glucose, UA: NEGATIVE mg/dL
Ketones, ur: NEGATIVE mg/dL
Nitrite: POSITIVE — AB
PH: 6 (ref 5.0–8.0)
Protein, ur: NEGATIVE mg/dL
SPECIFIC GRAVITY, URINE: 1.009 (ref 1.005–1.030)

## 2017-08-01 LAB — COMPREHENSIVE METABOLIC PANEL
ALBUMIN: 3.2 g/dL — AB (ref 3.5–5.0)
ALK PHOS: 49 U/L (ref 38–126)
ALT: 9 U/L — ABNORMAL LOW (ref 14–54)
AST: 16 U/L (ref 15–41)
Anion gap: 9 (ref 5–15)
BILIRUBIN TOTAL: 0.4 mg/dL (ref 0.3–1.2)
CALCIUM: 8.5 mg/dL — AB (ref 8.9–10.3)
CO2: 29 mmol/L (ref 22–32)
CREATININE: 0.95 mg/dL (ref 0.44–1.00)
Chloride: 95 mmol/L — ABNORMAL LOW (ref 101–111)
GFR calc Af Amer: >60 mL/min (ref >60)
GFR calc non Af Amer: >60 mL/min (ref >60)
GLUCOSE: 105 mg/dL — AB (ref 65–99)
Potassium: 3 mmol/L — ABNORMAL LOW (ref 3.5–5.1)
Sodium: 133 mmol/L — ABNORMAL LOW (ref 135–145)
TOTAL PROTEIN: 7.8 g/dL (ref 6.5–8.1)

## 2017-08-01 LAB — CBC WITH DIFFERENTIAL/PLATELET
BASOS ABS: 0.1 10*3/uL (ref 0.0–0.1)
BASOS PCT: 1 %
EOS PCT: 0 %
Eosinophils Absolute: 0 10*3/uL (ref 0.0–0.7)
HCT: 33.1 % — ABNORMAL LOW (ref 36.0–46.0)
Hemoglobin: 11.4 g/dL — ABNORMAL LOW (ref 12.0–15.0)
Lymphocytes Relative: 18 %
Lymphs Abs: 1.6 10*3/uL (ref 0.7–4.0)
MCH: 31 pg (ref 26.0–34.0)
MCHC: 34.4 g/dL (ref 30.0–36.0)
MCV: 89.9 fL (ref 78.0–100.0)
MONO ABS: 0.8 10*3/uL (ref 0.1–1.0)
Monocytes Relative: 9 %
Neutro Abs: 6.4 10*3/uL (ref 1.7–7.7)
Neutrophils Relative %: 72 %
PLATELETS: 279 10*3/uL (ref 150–400)
RBC: 3.68 MIL/uL — ABNORMAL LOW (ref 3.87–5.11)
RDW: 12.9 % (ref 11.5–15.5)
WBC: 8.9 10*3/uL (ref 4.0–10.5)

## 2017-08-01 LAB — PREGNANCY, URINE: PREG TEST UR: NEGATIVE

## 2017-08-01 MED ORDER — CEPHALEXIN 500 MG PO CAPS
500.0000 mg | ORAL_CAPSULE | Freq: Once | ORAL | Status: AC
  Administered 2017-08-02: 500 mg via ORAL
  Filled 2017-08-01: qty 1

## 2017-08-01 MED ORDER — CEPHALEXIN 500 MG PO CAPS: 500.0000 mg | ORAL_CAPSULE | Freq: Four times a day (QID) | ORAL | 0 refills | Status: AC

## 2017-08-01 MED ORDER — IOPAMIDOL (ISOVUE-300) INJECTION 61%
100.0000 mL | Freq: Once | INTRAVENOUS | Status: AC | PRN
  Administered 2017-08-01: 80 mL via INTRAVENOUS

## 2017-08-01 MED ORDER — FENTANYL CITRATE (PF) 100 MCG/2ML IJ SOLN
50.0000 ug | Freq: Once | INTRAMUSCULAR | Status: AC
  Administered 2017-08-01: 50 ug via INTRAVENOUS
  Filled 2017-08-01: qty 2

## 2017-08-01 MED ORDER — IOPAMIDOL (ISOVUE-300) INJECTION 61%
INTRAVENOUS | Status: AC
  Administered 2017-08-01: 30 mL via ORAL
  Filled 2017-08-01: qty 30

## 2017-08-01 MED ORDER — ONDANSETRON HCL 4 MG/2ML IJ SOLN
4.0000 mg | Freq: Once | INTRAMUSCULAR | Status: AC
  Administered 2017-08-01: 4 mg via INTRAVENOUS
  Filled 2017-08-01: qty 2

## 2017-08-01 MED ORDER — IOPAMIDOL (ISOVUE-300) INJECTION 61%
30.0000 mL | Freq: Once | INTRAVENOUS | Status: AC | PRN
  Administered 2017-08-01: 30 mL via ORAL

## 2017-08-01 MED ORDER — IOPAMIDOL (ISOVUE-300) INJECTION 61%
INTRAVENOUS | Status: AC
  Filled 2017-08-01: qty 100

## 2017-08-01 MED ORDER — SODIUM CHLORIDE 0.9 % IV BOLUS (SEPSIS)
1000.0000 mL | Freq: Once | INTRAVENOUS | Status: AC
  Administered 2017-08-01: 1000 mL via INTRAVENOUS

## 2017-08-01 MED ORDER — ONDANSETRON 4 MG PO TBDP: 4.0000 mg | ORAL_TABLET | Freq: Three times a day (TID) | ORAL | 0 refills | Status: DC | PRN

## 2017-08-01 NOTE — Discharge Instructions (Signed)
Please take antibiotics as prescribed. You have an infection which moved from your bladder into your kidneys. If you're unable take antibiotics at home or feel increasingly sick at home please return immediately to the emergency department.
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
## 2017-08-01 NOTE — ED Triage Notes (Signed)
Pt complains of nausea, abdominal pain since 7/5. Pt had abscess in her abdomen last year that she believes may be contributing to her current symptoms. Pt states she gets a fever every night.
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
## 2024-03-27 ENCOUNTER — Ambulatory Visit

## 2024-04-14 ENCOUNTER — Ambulatory Visit (HOSPITAL_BASED_OUTPATIENT_CLINIC_OR_DEPARTMENT_OTHER)
Admission: EM | Admit: 2024-04-14 | Discharge: 2024-04-14 | Disposition: A | Discharge status: Home / Self Care | Attending: Emergency Medicine | Admitting: Emergency Medicine

## 2024-04-14 ENCOUNTER — Encounter (HOSPITAL_BASED_OUTPATIENT_CLINIC_OR_DEPARTMENT_OTHER): Payer: Self-pay

## 2024-04-14 DIAGNOSIS — M67949 Unspecified disorder of synovium and tendon, unspecified hand: Secondary | ICD-10-CM | POA: Diagnosis not present

## 2024-04-14 NOTE — Discharge Instructions (Signed)
 Wear the brace for support Please follow up with orthopedics!  [facility] [phone] [phone]

## 2024-04-14 NOTE — ED Triage Notes (Signed)
 Pt states woke up at 4am and has no muscle control in lt hand. Denies numbness/tingling/pain. States unable to use it.
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
## 2024-04-23 ENCOUNTER — Ambulatory Visit (HOSPITAL_BASED_OUTPATIENT_CLINIC_OR_DEPARTMENT_OTHER): Admitting: Student
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
